# Patient Record
Sex: Female | Born: 1980 | Race: Black or African American | Hispanic: No | Marital: Single | Smoking: Never smoker
Health system: Southern US, Community
[De-identification: ages and names within clinical notes are randomized; demographics above are authoritative.]

## PROBLEM LIST (undated history)

## (undated) DIAGNOSIS — G43909 Migraine, unspecified, not intractable, without status migrainosus: Secondary | ICD-10-CM

## (undated) DIAGNOSIS — O039 Complete or unspecified spontaneous abortion without complication: Secondary | ICD-10-CM

## (undated) DIAGNOSIS — J020 Streptococcal pharyngitis: Secondary | ICD-10-CM

## (undated) HISTORY — PX: HERNIA REPAIR: SHX51

---
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
## 1999-01-24 ENCOUNTER — Encounter: Admission: RE | Admit: 1999-01-24 | Discharge: 1999-01-24 | Payer: Self-pay | Admitting: Family Medicine

## 2003-09-11 ENCOUNTER — Emergency Department (HOSPITAL_COMMUNITY): Admission: EM | Admit: 2003-09-11 | Discharge: 2003-09-12 | Payer: Self-pay | Admitting: Emergency Medicine

## 2004-08-30 ENCOUNTER — Ambulatory Visit: Payer: Self-pay | Admitting: Internal Medicine

## 2006-09-11 ENCOUNTER — Emergency Department (HOSPITAL_COMMUNITY): Admission: EM | Admit: 2006-09-11 | Discharge: 2006-09-11 | Payer: Self-pay | Admitting: Emergency Medicine

## 2008-04-23 ENCOUNTER — Emergency Department (HOSPITAL_COMMUNITY): Admission: EM | Admit: 2008-04-23 | Discharge: 2008-04-24 | Payer: Self-pay | Admitting: Emergency Medicine

## 2009-04-27 ENCOUNTER — Emergency Department (HOSPITAL_COMMUNITY): Admission: EM | Admit: 2009-04-27 | Discharge: 2009-04-27 | Payer: Self-pay | Admitting: Emergency Medicine

## 2012-07-17 DIAGNOSIS — O039 Complete or unspecified spontaneous abortion without complication: Secondary | ICD-10-CM

## 2012-07-17 HISTORY — DX: Complete or unspecified spontaneous abortion without complication: O03.9

## 2013-07-19 ENCOUNTER — Emergency Department (HOSPITAL_COMMUNITY)
Admission: EM | Admit: 2013-07-19 | Discharge: 2013-07-19 | Disposition: A | Discharge status: Home / Self Care | Payer: Self-pay | Attending: Emergency Medicine | Admitting: Emergency Medicine

## 2013-07-19 ENCOUNTER — Encounter (HOSPITAL_COMMUNITY): Payer: Self-pay | Admitting: Emergency Medicine

## 2013-07-19 DIAGNOSIS — J020 Streptococcal pharyngitis: Secondary | ICD-10-CM | POA: Insufficient documentation

## 2013-07-19 LAB — RAPID STREP SCREEN (MED CTR MEBANE ONLY): Streptococcus, Group A Screen (Direct): POSITIVE — AB

## 2013-07-19 MED ORDER — AMOXICILLIN-POT CLAVULANATE 875-125 MG PO TABS: 1.0000 | ORAL_TABLET | Freq: Two times a day (BID) | ORAL | Status: DC

## 2013-07-19 NOTE — ED Provider Notes (Signed)
CSN: [id number]     Arrival date & time 07/19/13  2001 History  This chart was scribed for non-physician practitioner, Paulita Cradle, PA-C,working with Layla Maw Ward, DO, by Karle Plumber, ED Scribe.  This patient was seen in room WTR6/WTR6 and the patient's care was started at 9:48 PM.  Chief Complaint  Patient presents with  . Sore Throat   The history is provided by the patient. No language interpreter was used.   HPI Comments:  Joanne Shelton is a 33 y.o. female who presents to the Emergency Department complaining of a worsening sore throat. The pain is aching and severe and equal on both sides. Pt states she was treated for strep and finished the 10-day amoxicillin course seven days ago. Pt states her symptoms improved, but returned yesterday as itchy, scratchy pain and white patches on her throat. She states she experienced emesis yesterday that has now resolved. Pt denies fever.   History reviewed. No pertinent past medical history. History reviewed. No pertinent past surgical history. History reviewed. No pertinent family history. History  Substance Use Topics  . Smoking status: Never Smoker   . Smokeless tobacco: Not on file  . Alcohol Use: Yes     Comment: occ   OB History   Grav Para Term Preterm Abortions TAB SAB Ect Mult Living                 Review of Systems  Constitutional: Negative for fever.  HENT: Positive for sore throat.   All other systems reviewed and are negative.    Allergies  Codeine  Home Medications   Current Outpatient Rx  Name  Route  Sig  Dispense  Refill  . DiphenhydrAMINE HCl (ALLERGY MEDICATION PO)   Oral   Take 1 tablet by mouth daily as needed (allergy symptoms).         . Multiple Vitamin (MULTIVITAMIN WITH MINERALS) TABS tablet   Oral   Take 1 tablet by mouth daily.         . norgestimate-ethinyl estradiol (ORTHO-CYCLEN,SPRINTEC,PREVIFEM) 0.25-35 MG-MCG tablet   Oral   Take 1 tablet by mouth daily.           Triage Vitals: BP 120/71  Pulse 97  Temp(Src) 98.3 F (36.8 C) (Oral)  Resp 18  Ht 5\' 2"  (1.575 m)  Wt 127 lb (57.607 kg)  BMI 23.22 kg/m2  SpO2 100%  LMP 07/13/2013 Physical Exam  Nursing note and vitals reviewed. Constitutional: She is oriented to person, place, and time. She appears well-developed and well-nourished.  HENT:  Head: Normocephalic and atraumatic.  Bilateral tonsillar erythema with white exudate.   Eyes: EOM are normal.  Neck: Normal range of motion.  Cardiovascular: Normal rate.   Pulmonary/Chest: Effort normal.  Musculoskeletal: Normal range of motion.  Lymphadenopathy:    She has cervical adenopathy.  Neurological: She is alert and oriented to person, place, and time.  Skin: Skin is warm and dry.  Psychiatric: She has a normal mood and affect. Her behavior is normal.    ED Course  Procedures (including critical care time) DIAGNOSTIC STUDIES: Oxygen Saturation is 100% on RA, normal by my interpretation.   COORDINATION OF CARE: 9:51 PM- Will treat with Augmentin for strep throat. Pt verbalizes understanding and agrees to plan.  Medications - No data to display  Labs Review Labs Reviewed  RAPID STREP SCREEN - Abnormal; Notable for the following:    Streptococcus, Group A Screen (Direct) POSITIVE (*)    All other components  within normal limits   Imaging Review No results found.  EKG Interpretation   None       MDM   1. Strep throat     10:00 PM Patient recently finished a course of amoxicillin for strep throat. Patient will have Augmentin this time due to suspected failure of treatment. Vitals stable and patient afebrile. Patient will return to the ED with worsening or concerning symptoms.   I personally performed the services described in this documentation, which was scribed in my presence. The recorded information has been reviewed and is accurate.    Emilia BeckKaitlyn Rett Stehlik, PA-C 07/19/13 2201

## 2013-07-19 NOTE — Discharge Instructions (Signed)
Take augmentin as directed until gone. Refer to attached documents for more information. Return to the ED with worsening or concerning symptoms.  °

## 2013-07-19 NOTE — ED Notes (Signed)
Patient reports that she has recently been treated for strep. The patient reports that the "white spotsd are back"

## 2013-07-19 NOTE — ED Provider Notes (Signed)
Medical screening examination/treatment/procedure(s) were performed by non-physician practitioner and as supervising physician I was immediately available for consultation/collaboration.  EKG Interpretation   None         Gatlin Kittell N Netanya Yazdani, DO 07/19/13 2350 

## 2014-06-28 ENCOUNTER — Emergency Department (HOSPITAL_COMMUNITY)
Admission: EM | Admit: 2014-06-28 | Discharge: 2014-06-28 | Disposition: A | Discharge status: Home / Self Care | Payer: Self-pay | Attending: Emergency Medicine | Admitting: Emergency Medicine

## 2014-06-28 ENCOUNTER — Encounter (HOSPITAL_COMMUNITY): Payer: Self-pay

## 2014-06-28 DIAGNOSIS — Z79899 Other long term (current) drug therapy: Secondary | ICD-10-CM | POA: Insufficient documentation

## 2014-06-28 DIAGNOSIS — Z791 Long term (current) use of non-steroidal anti-inflammatories (NSAID): Secondary | ICD-10-CM | POA: Insufficient documentation

## 2014-06-28 DIAGNOSIS — Z792 Long term (current) use of antibiotics: Secondary | ICD-10-CM | POA: Insufficient documentation

## 2014-06-28 DIAGNOSIS — R000 Tachycardia, unspecified: Secondary | ICD-10-CM | POA: Insufficient documentation

## 2014-06-28 DIAGNOSIS — J0100 Acute maxillary sinusitis, unspecified: Secondary | ICD-10-CM | POA: Insufficient documentation

## 2014-06-28 LAB — RAPID STREP SCREEN (MED CTR MEBANE ONLY): Streptococcus, Group A Screen (Direct): NEGATIVE

## 2014-06-28 MED ORDER — DEXAMETHASONE SODIUM PHOSPHATE 10 MG/ML IJ SOLN
10.0000 mg | Freq: Once | INTRAMUSCULAR | Status: AC
  Administered 2014-06-28: 10 mg via INTRAMUSCULAR
  Filled 2014-06-28: qty 1

## 2014-06-28 MED ORDER — NAPROXEN 500 MG PO TABS
500.0000 mg | ORAL_TABLET | Freq: Once | ORAL | Status: AC
  Administered 2014-06-28: 500 mg via ORAL
  Filled 2014-06-28: qty 1

## 2014-06-28 MED ORDER — NAPROXEN 500 MG PO TABS: 500.0000 mg | ORAL_TABLET | Freq: Two times a day (BID) | ORAL | Status: DC

## 2014-06-28 MED ORDER — SALINE SPRAY 0.65 % NA SOLN
1.0000 | Freq: Once | NASAL | Status: AC
  Administered 2014-06-28: 1 via NASAL
  Filled 2014-06-28: qty 44

## 2014-06-28 NOTE — ED Provider Notes (Signed)
CSN: [id number]     Arrival date & time 06/28/14  2053 History   First MD Initiated Contact with Patient 06/28/14 2126     This chart was scribed for non-physician practitioner, Antony MaduraKelly Sacoya Mcgourty, PA-C working with Warnell Foresterrey Wofford, MD by Arlan OrganAshley Leger, ED Scribe. This patient was seen in room WTR7/WTR7 and the patient's care was started at 10:00 PM.   Chief Complaint  Patient presents with  . Sore Throat  . Headache   The history is provided by the patient. No language interpreter was used.    HPI Comments: Joanne Shelton is a 33 y.o. female who presents to the Emergency Department complaining of constant sore throat onset this morning that has progressively worsened. She also reports painful swallowing, rhinorrhea, and nasal congestion. Pt took 10 ml of Amoxicillin at 5:30 this evening without any improvement. She has also tried L-lysine with no relief for symptoms. Pt denies any drooling, inability to swallow, vomiting, cough, SOB, diarrhea. She states her boyfriend was recently sick and she drank from his straw yesterday. Pt recently had strep approximately 1 month ago and was treated with Amoxicillin. Pt with known allergy to Codeine.  History reviewed. No pertinent past medical history. History reviewed. No pertinent past surgical history. History reviewed. No pertinent family history. History  Substance Use Topics  . Smoking status: Never Smoker   . Smokeless tobacco: Not on file  . Alcohol Use: Yes     Comment: occ   OB History    No data available      Review of Systems  Constitutional: Positive for chills. Negative for fever.  HENT: Positive for congestion, rhinorrhea and sore throat. Negative for trouble swallowing.   Neurological: Positive for headaches.  All other systems reviewed and are negative.   Allergies  Codeine  Home Medications   Prior to Admission medications   Medication Sig Start Date End Date Taking? Authorizing Provider  AMOXICILLIN PO Take 10 mLs by  mouth 2 (two) times daily.   Yes Historical Provider, MD  Chlorpheniramine Maleate (ALLERGY PO) Take 1 tablet by mouth daily.   Yes Historical Provider, MD  L-Lysine 500 MG TABS Take 1 tablet by mouth daily.   Yes Historical Provider, MD  medroxyPROGESTERone (DEPO-PROVERA) 150 MG/ML injection Inject 150 mg into the muscle every 3 (three) months.   Yes Historical Provider, MD  amoxicillin-clavulanate (AUGMENTIN) 875-125 MG per tablet Take 1 tablet by mouth every 12 (twelve) hours. 07/19/13   Kaitlyn Szekalski, PA-C  naproxen (NAPROSYN) 500 MG tablet Take 1 tablet (500 mg total) by mouth 2 (two) times daily. 06/28/14   Antony MaduraKelly Lilith Solana, PA-C   Triage Vitals: BP 107/69 mmHg  Temp(Src) 98.5 F (36.9 C) (Oral)  Resp 125  SpO2 100%   Physical Exam  Constitutional: She is oriented to person, place, and time. She appears well-developed and well-nourished. No distress.  Nontoxic/nonseptic appearing  HENT:  Head: Normocephalic and atraumatic.  Right Ear: Tympanic membrane, external ear and ear canal normal.  Left Ear: Tympanic membrane, external ear and ear canal normal.  Nose: Right sinus exhibits maxillary sinus tenderness and frontal sinus tenderness (Mild). Left sinus exhibits no maxillary sinus tenderness and no frontal sinus tenderness.  Mouth/Throat: Uvula is midline and mucous membranes are normal. Posterior oropharyngeal erythema present. No oropharyngeal exudate or posterior oropharyngeal edema.  Mild tonsillar erythema bilaterally without exudates. Uvula midline. Patient tolerating secretions without difficulty. No voice changes or muffling.  Eyes: Conjunctivae and EOM are normal. Pupils are equal, round, and reactive  to light. No scleral icterus.  Neck: Normal range of motion. Neck supple.  No nuchal rigidity or meningismus  Cardiovascular: Regular rhythm, normal heart sounds and intact distal pulses.  Tachycardia present.   Mild tachycardia  Pulmonary/Chest: Effort normal. No respiratory  distress. She has no wheezes. She has no rales.  Lungs clear to auscultation. Respirations even and unlabored.  Abdominal: She exhibits no distension.  Musculoskeletal: Normal range of motion.  Neurological: She is alert and oriented to person, place, and time. She exhibits normal muscle tone. Coordination normal.  Skin: Skin is warm and dry. No rash noted. She is not diaphoretic. No erythema. No pallor.  Psychiatric: She has a normal mood and affect. Her behavior is normal.  Nursing note and vitals reviewed.   ED Course  Procedures (including critical care time)  DIAGNOSTIC STUDIES: Oxygen Saturation is 100% on RA, Normal by my interpretation.    COORDINATION OF CARE: 10:03 PM- Will order rapid strep screen. Discussed treatment plan with pt at bedside and pt agreed to plan.     Labs Review Labs Reviewed  RAPID STREP SCREEN  CULTURE, GROUP A STREP    Imaging Review No results found.   EKG Interpretation None      MDM   Final diagnoses:  Acute maxillary sinusitis, recurrence not specified    Patient complaining of symptoms of sinusitis. Mild to moderate symptoms of clear/yellow nasal discharge/congestion and scratchy throat with cough for less than 10 days. Patient is afebrile. No concern for acute bacterial rhinosinusitis; likely viral in nature. Patient discharged with symptomatic treatment. Patient instructions given for warm saline nasal washes. Recommendations for follow-up with primary care physician. Return precautions provided and patient agreeable to plan with no unaddressed concerns.  I personally performed the services described in this documentation, which was scribed in my presence. The recorded information has been reviewed and is accurate.     Antony MaduraKelly Callyn Severtson, PA-C 06/28/14 2212  Warnell Foresterrey Wofford, MD 07/01/14 [phone]

## 2014-06-28 NOTE — ED Notes (Signed)
Pt states that she woke up this am and felt like she had strep, she states that she has a hx of strep, she became really hot and achy, she also complains of a headache

## 2014-06-28 NOTE — Discharge Instructions (Signed)
Recommend Ocean Nasal spray and Naproxen as well as rest and fluid hydration. You may use over the counter decongestants as needed for sinus congestion. Follow up with a primary care doctor. Return as needed if symptoms worsen.  Sinusitis Sinusitis is redness, soreness, and inflammation of the paranasal sinuses. Paranasal sinuses are air pockets within the bones of your face (beneath the eyes, the middle of the forehead, or above the eyes). In healthy paranasal sinuses, mucus is able to drain out, and air is able to circulate through them by way of your nose. However, when your paranasal sinuses are inflamed, mucus and air can become trapped. This can allow bacteria and other germs to grow and cause infection. Sinusitis can develop quickly and last only a short time (acute) or continue over a long period (chronic). Sinusitis that lasts for more than 12 weeks is considered chronic.  CAUSES  Causes of sinusitis include:  Allergies.  Structural abnormalities, such as displacement of the cartilage that separates your nostrils (deviated septum), which can decrease the air flow through your nose and sinuses and affect sinus drainage.  Functional abnormalities, such as when the small hairs (cilia) that line your sinuses and help remove mucus do not work properly or are not present. SIGNS AND SYMPTOMS  Symptoms of acute and chronic sinusitis are the same. The primary symptoms are pain and pressure around the affected sinuses. Other symptoms include:  Upper toothache.  Earache.  Headache.  Bad breath.  Decreased sense of smell and taste.  A cough, which worsens when you are lying flat.  Fatigue.  Fever.  Thick drainage from your nose, which often is green and may contain pus (purulent).  Swelling and warmth over the affected sinuses. DIAGNOSIS  Your health care provider will perform a physical exam. During the exam, your health care provider may:  Look in your nose for signs of abnormal  growths in your nostrils (nasal polyps).  Tap over the affected sinus to check for signs of infection.  View the inside of your sinuses (endoscopy) using an imaging device that has a light attached (endoscope). If your health care provider suspects that you have chronic sinusitis, one or more of the following tests may be recommended:  Allergy tests.  Nasal culture. A sample of mucus is taken from your nose, sent to a lab, and screened for bacteria.  Nasal cytology. A sample of mucus is taken from your nose and examined by your health care provider to determine if your sinusitis is related to an allergy. TREATMENT  Most cases of acute sinusitis are related to a viral infection and will resolve on their own within 10 days. Sometimes medicines are prescribed to help relieve symptoms (pain medicine, decongestants, nasal steroid sprays, or saline sprays).  However, for sinusitis related to a bacterial infection, your health care provider will prescribe antibiotic medicines. These are medicines that will help kill the bacteria causing the infection.  Rarely, sinusitis is caused by a fungal infection. In theses cases, your health care provider will prescribe antifungal medicine. For some cases of chronic sinusitis, surgery is needed. Generally, these are cases in which sinusitis recurs more than 3 times per year, despite other treatments. HOME CARE INSTRUCTIONS   Drink plenty of water. Water helps thin the mucus so your sinuses can drain more easily.  Use a humidifier.  Inhale steam 3 to 4 times a day (for example, sit in the bathroom with the shower running).  Apply a warm, moist washcloth to your  face 3 to 4 times a day, or as directed by your health care provider.  Use saline nasal sprays to help moisten and clean your sinuses.  Take medicines only as directed by your health care provider.  If you were prescribed either an antibiotic or antifungal medicine, finish it all even if you start  to feel better. SEEK IMMEDIATE MEDICAL CARE IF:  You have increasing pain or severe headaches.  You have nausea, vomiting, or drowsiness.  You have swelling around your face.  You have vision problems.  You have a stiff neck.  You have difficulty breathing. MAKE SURE YOU:   Understand these instructions.  Will watch your condition.  Will get help right away if you are not doing well or get worse. Document Released: 07/03/2005 Document Revised: 11/17/2013 Document Reviewed: 07/18/2011 [facility] Patient Information 2015 [employer] [place]. This information is not intended to replace advice given to you by your health care provider. Make sure you discuss any questions you have with your health care provider.

## 2014-06-28 NOTE — ED Notes (Signed)
Pt took one dose of amoxicillian at 1730 this evening.

## 2014-06-30 LAB — CULTURE, GROUP A STREP

## 2014-11-23 ENCOUNTER — Encounter (HOSPITAL_COMMUNITY): Payer: Self-pay | Admitting: Emergency Medicine

## 2014-11-23 ENCOUNTER — Emergency Department (HOSPITAL_COMMUNITY)
Admission: EM | Admit: 2014-11-23 | Discharge: 2014-11-23 | Disposition: A | Discharge status: Home / Self Care | Payer: Self-pay | Attending: Emergency Medicine | Admitting: Emergency Medicine

## 2014-11-23 DIAGNOSIS — Z792 Long term (current) use of antibiotics: Secondary | ICD-10-CM | POA: Insufficient documentation

## 2014-11-23 DIAGNOSIS — J029 Acute pharyngitis, unspecified: Secondary | ICD-10-CM | POA: Insufficient documentation

## 2014-11-23 DIAGNOSIS — Z791 Long term (current) use of non-steroidal anti-inflammatories (NSAID): Secondary | ICD-10-CM | POA: Insufficient documentation

## 2014-11-23 LAB — RAPID STREP SCREEN (MED CTR MEBANE ONLY): Streptococcus, Group A Screen (Direct): NEGATIVE

## 2014-11-23 NOTE — ED Provider Notes (Signed)
CSN: [id number]     Arrival date & time 11/23/14  1725 History  This chart was scribed for non-physician practitioner, Santiago GladHeather Rogue Pautler, PA-C,working with Raeford RazorStephen Kohut, MD, by Karle PlumberJennifer Tensley, ED Scribe. This patient was seen in room WTR5/WTR5 and the patient's care was started at 6:24 PM.  Chief Complaint  Patient presents with  . Sore Throat   Patient is a 34 y.o. female presenting with pharyngitis. The history is provided by the patient and medical records. No language interpreter was used.  Sore Throat Pertinent negatives include no headaches.    HPI Comments:  Joanne Shelton is a 34 y.o. female who presents to the Emergency Department complaining of worsening sore throat that began two days ago. She reports associated subjective fever and swollen tonsils. She reports coughing. She has not done anything to treat her symptoms. Swallowing makes the pain worse. Denies alleviating factors. Denies nausea, vomiting, chills or HA.  History reviewed. No pertinent past medical history. History reviewed. No pertinent past surgical history. History reviewed. No pertinent family history. History  Substance Use Topics  . Smoking status: Never Smoker   . Smokeless tobacco: Not on file  . Alcohol Use: Yes     Comment: occ   OB History    No data available     Review of Systems  Constitutional: Positive for fever (subjective).  HENT: Positive for sore throat.   Respiratory: Positive for cough.   Gastrointestinal: Negative for nausea and vomiting.  Neurological: Negative for headaches.    Allergies  Codeine  Home Medications   Prior to Admission medications   Medication Sig Start Date End Date Taking? Authorizing Provider  AMOXICILLIN PO Take 10 mLs by mouth 2 (two) times daily.    Historical Provider, MD  amoxicillin-clavulanate (AUGMENTIN) 875-125 MG per tablet Take 1 tablet by mouth every 12 (twelve) hours. 07/19/13   Kaitlyn Szekalski, PA-C  Chlorpheniramine Maleate (ALLERGY PO)  Take 1 tablet by mouth daily.    Historical Provider, MD  L-Lysine 500 MG TABS Take 1 tablet by mouth daily.    Historical Provider, MD  medroxyPROGESTERone (DEPO-PROVERA) 150 MG/ML injection Inject 150 mg into the muscle every 3 (three) months.    Historical Provider, MD  naproxen (NAPROSYN) 500 MG tablet Take 1 tablet (500 mg total) by mouth 2 (two) times daily. 06/28/14   Antony MaduraKelly Humes, PA-C   Triage Vitals: BP 110/70 mmHg  Pulse 108  Temp(Src) 98.1 F (36.7 C) (Oral)  Resp 16  SpO2 100% Physical Exam  Constitutional: She is oriented to person, place, and time. She appears well-developed and well-nourished.  HENT:  Head: Normocephalic and atraumatic.  Mouth/Throat: Uvula is midline, oropharynx is clear and moist and mucous membranes are normal. No trismus in the jaw. No uvula swelling. No oropharyngeal exudate, posterior oropharyngeal edema, posterior oropharyngeal erythema or tonsillar abscesses.  Eyes: EOM are normal.  Neck: Normal range of motion.  Cardiovascular: Normal rate, regular rhythm and normal heart sounds.   Pulmonary/Chest: Effort normal and breath sounds normal.  Musculoskeletal: Normal range of motion.  Lymphadenopathy:    She has no cervical adenopathy.  Neurological: She is alert and oriented to person, place, and time.  Skin: Skin is warm and dry.  Psychiatric: She has a normal mood and affect. Her behavior is normal.  Nursing note and vitals reviewed.   ED Course  Procedures (including critical care time) DIAGNOSTIC STUDIES: Oxygen Saturation is 100% on RA, normal by my interpretation.   COORDINATION OF CARE: 6:28 PM- Advised  pt that her symptoms were likely viral and strep test is negative. Advised pt to follow up with PCP for continued or worsening symptoms. Pt verbalizes understanding and agrees to plan.  Medications - No data to display  Labs Review Labs Reviewed  RAPID STREP SCREEN  CULTURE, GROUP A STREP    Imaging Review No results found.    EKG Interpretation None      MDM   Final diagnoses:  None   Patient presents today with sore throat.  Rapid strep negative.  No signs of Peritonsillar Abscess or Retropharyngeal Abscess.  Patient stable for discharge.  Return precautions given.    I personally performed the services described in this documentation, which was scribed in my presence. The recorded information has been reviewed and is accurate.    Santiago GladHeather Kihanna Kamiya, PA-C 11/25/14 2142  Raeford RazorStephen Kohut, MD 11/26/14 [phone]

## 2014-11-23 NOTE — ED Notes (Signed)
Patient c/o dry throat, enlarged reddened tonsils onset 2 days ago. Pt states she has Hx of frequent strep throat.

## 2014-11-27 LAB — CULTURE, GROUP A STREP

## 2015-09-14 ENCOUNTER — Encounter (HOSPITAL_COMMUNITY): Payer: Self-pay

## 2015-09-14 ENCOUNTER — Emergency Department (HOSPITAL_COMMUNITY)
Admission: EM | Admit: 2015-09-14 | Discharge: 2015-09-14 | Disposition: A | Discharge status: Home / Self Care | Payer: Self-pay | Attending: Emergency Medicine | Admitting: Emergency Medicine

## 2015-09-14 DIAGNOSIS — Z79899 Other long term (current) drug therapy: Secondary | ICD-10-CM | POA: Insufficient documentation

## 2015-09-14 DIAGNOSIS — Z77098 Contact with and (suspected) exposure to other hazardous, chiefly nonmedicinal, chemicals: Secondary | ICD-10-CM | POA: Insufficient documentation

## 2015-09-14 DIAGNOSIS — Z791 Long term (current) use of non-steroidal anti-inflammatories (NSAID): Secondary | ICD-10-CM | POA: Insufficient documentation

## 2015-09-14 DIAGNOSIS — Z792 Long term (current) use of antibiotics: Secondary | ICD-10-CM | POA: Insufficient documentation

## 2015-09-14 MED ORDER — ERYTHROMYCIN 5 MG/GM OP OINT: TOPICAL_OINTMENT | OPHTHALMIC | Status: DC

## 2015-09-14 NOTE — ED Notes (Signed)
Per EMS- Patient was at work cleaning and sat the bleach down which splashed on her face and into her right eye only. Patient rinsed right eye with 25 large cups of water prior to EMS arrival. Patient has slight redness to the right eye and c/o stinging and blurred vision.
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
## 2015-09-14 NOTE — Discharge Instructions (Signed)
Please read attached information. If you experience any new or worsening signs or symptoms please return to the emergency room for evaluation. Please follow-up with your primary care provider or specialist as discussed. Please use medication prescribed only as directed and discontinue taking if you have any concerning signs or symptoms.   °

## 2016-05-24 ENCOUNTER — Encounter (HOSPITAL_COMMUNITY): Payer: Self-pay | Admitting: Emergency Medicine

## 2016-05-24 ENCOUNTER — Emergency Department (HOSPITAL_COMMUNITY)
Admission: EM | Admit: 2016-05-24 | Discharge: 2016-05-24 | Disposition: A | Discharge status: Home / Self Care | Payer: Self-pay | Attending: Emergency Medicine | Admitting: Emergency Medicine

## 2016-05-24 DIAGNOSIS — N938 Other specified abnormal uterine and vaginal bleeding: Secondary | ICD-10-CM | POA: Insufficient documentation

## 2016-05-24 DIAGNOSIS — G43809 Other migraine, not intractable, without status migrainosus: Secondary | ICD-10-CM | POA: Insufficient documentation

## 2016-05-24 LAB — BASIC METABOLIC PANEL
Anion gap: 5 (ref 5–15)
BUN: 10 mg/dL (ref 6–20)
CALCIUM: 8.8 mg/dL — AB (ref 8.9–10.3)
CO2: 26 mmol/L (ref 22–32)
CREATININE: 0.62 mg/dL (ref 0.44–1.00)
Chloride: 108 mmol/L (ref 101–111)
GFR calc Af Amer: >60 mL/min (ref >60)
GLUCOSE: 79 mg/dL (ref 65–99)
Potassium: 3.8 mmol/L (ref 3.5–5.1)
SODIUM: 139 mmol/L (ref 135–145)

## 2016-05-24 LAB — CBC
HCT: 34.8 % — ABNORMAL LOW (ref 36.0–46.0)
Hemoglobin: 11.2 g/dL — ABNORMAL LOW (ref 12.0–15.0)
MCH: 22.3 pg — AB (ref 26.0–34.0)
MCHC: 32.2 g/dL (ref 30.0–36.0)
MCV: 69.3 fL — AB (ref 78.0–100.0)
PLATELETS: 263 10*3/uL (ref 150–400)
RBC: 5.02 MIL/uL (ref 3.87–5.11)
RDW: 15.1 % (ref 11.5–15.5)
WBC: 5.9 10*3/uL (ref 4.0–10.5)

## 2016-05-24 LAB — URINALYSIS, ROUTINE W REFLEX MICROSCOPIC
BILIRUBIN URINE: NEGATIVE
GLUCOSE, UA: NEGATIVE mg/dL
KETONES UR: NEGATIVE mg/dL
Leukocytes, UA: NEGATIVE
Nitrite: NEGATIVE
PH: 6 (ref 5.0–8.0)
Protein, ur: NEGATIVE mg/dL
SPECIFIC GRAVITY, URINE: 1.008 (ref 1.005–1.030)

## 2016-05-24 LAB — URINE MICROSCOPIC-ADD ON
Bacteria, UA: NONE SEEN
RBC / HPF: NONE SEEN RBC/hpf (ref 0–5)
WBC UA: NONE SEEN WBC/hpf (ref 0–5)

## 2016-05-24 LAB — I-STAT BETA HCG BLOOD, ED (MC, WL, AP ONLY)

## 2016-05-24 MED ORDER — ONDANSETRON 8 MG PO TBDP: 8.0000 mg | ORAL_TABLET | Freq: Three times a day (TID) | ORAL | 0 refills | Status: DC | PRN

## 2016-05-24 MED ORDER — NAPROXEN 500 MG PO TABS: 500.0000 mg | ORAL_TABLET | Freq: Two times a day (BID) | ORAL | 0 refills | Status: DC

## 2016-05-24 MED ORDER — KETOROLAC TROMETHAMINE 30 MG/ML IJ SOLN
30.0000 mg | Freq: Once | INTRAMUSCULAR | Status: AC
  Administered 2016-05-24: 30 mg via INTRAVENOUS
  Filled 2016-05-24: qty 1

## 2016-05-24 MED ORDER — ONDANSETRON HCL 4 MG/2ML IJ SOLN
4.0000 mg | Freq: Once | INTRAMUSCULAR | Status: AC
  Administered 2016-05-24: 4 mg via INTRAVENOUS
  Filled 2016-05-24: qty 2

## 2016-05-24 MED ORDER — SODIUM CHLORIDE 0.9 % IV BOLUS (SEPSIS)
1000.0000 mL | Freq: Once | INTRAVENOUS | Status: AC
  Administered 2016-05-24: 1000 mL via INTRAVENOUS

## 2016-05-24 MED ORDER — PROCHLORPERAZINE EDISYLATE 5 MG/ML IJ SOLN: 10.0000 mg | Freq: Four times a day (QID) | INTRAMUSCULAR | Status: DC | PRN

## 2016-05-24 NOTE — ED Triage Notes (Signed)
Patient states she has a history of migraines.This migraine started last night.  Upon waking, she has a headache, light sensivity, seeing white spots, pain that radiates to her right neck and shoulder.  Patient has taken 5 ibouhpren since 9pm last night. Denies LOC.

## 2016-05-24 NOTE — ED Provider Notes (Signed)
[facility]-EMERGENCY DEPT Provider Note   CSN: [id number] Arrival date & time: 05/24/16  1123     History   Chief Complaint Chief Complaint  Patient presents with  . Migraine    HPI Joanne Shelton is a 35 y.o. female.  HPI Patient ports long-standing history of migraine reports worsening right-sided headache with some visual spots noted out of her right eye.  She is sensitive to light.  She reports the pain radiates from her right lateral face and her right neck and shoulder.  Should ibuprofen last night without any improvement in her symptoms.  She denies weakness of her arms or legs.  No recent injury or trauma.  Her pain is severe in severity.  No other complaints at this time.  No fevers or chills or neck pain.   History reviewed. No pertinent past medical history.  There are no active problems to display for this patient.   Past Surgical History:  Procedure Laterality Date  . HERNIA REPAIR      OB History    No data available       Home Medications    Prior to Admission medications   Medication Sig Start Date End Date Taking? Authorizing Provider  cetirizine (ZYRTEC) 10 MG tablet Take 10 mg by mouth daily.   Yes Historical Provider, MD  ibuprofen (ADVIL,MOTRIN) 200 MG tablet Take 1,000 mg by mouth every 6 (six) hours as needed for mild pain (pain).   Yes Historical Provider, MD  norgestimate-ethinyl estradiol (ORTHO-CYCLEN,SPRINTEC,PREVIFEM) 0.25-35 MG-MCG tablet Take 1 tablet by mouth daily. Pt states she was only put on med for migraines, stopped taking it regularly due to ineffectiveness   Yes Historical Provider, MD  naproxen (NAPROSYN) 500 MG tablet Take 1 tablet (500 mg total) by mouth 2 (two) times daily. 05/24/16   Azalia BilisKevin Lyrique Hakim, MD  ondansetron (ZOFRAN ODT) 8 MG disintegrating tablet Take 1 tablet (8 mg total) by mouth every 8 (eight) hours as needed for nausea or vomiting. 05/24/16   Azalia BilisKevin Etna Forquer, MD    Family History No family history on file.  Social  History Social History  Substance Use Topics  . Smoking status: Never Smoker  . Smokeless tobacco: Never Used  . Alcohol use Yes     Comment: occ     Allergies   Codeine   Review of Systems Review of Systems  All other systems reviewed and are negative.    Physical Exam Updated Vital Signs BP 119/91 (BP Location: Left Arm)   Pulse 77   Temp 98.3 F (36.8 C) (Oral)   Resp 16   Ht 5\' 1"  (1.549 m)   Wt 125 lb (56.7 kg)   LMP 05/21/2016   SpO2 100%   BMI 23.62 kg/m   Physical Exam  Constitutional: She is oriented to person, place, and time. She appears well-developed and well-nourished. No distress.  HENT:  Head: Normocephalic and atraumatic.  Eyes: EOM are normal. Pupils are equal, round, and reactive to light.  Neck: Normal range of motion.  Cardiovascular: Normal rate, regular rhythm and normal heart sounds.   Pulmonary/Chest: Effort normal and breath sounds normal.  Abdominal: Soft. She exhibits no distension. There is no tenderness.  Musculoskeletal: Normal range of motion.  Neurological: She is alert and oriented to person, place, and time.  5/5 strength in major muscle groups of  bilateral upper and lower extremities. Speech normal. No facial asymetry.   Skin: Skin is warm and dry.  Psychiatric: She has a normal mood and  affect. Judgment normal.  Nursing note and vitals reviewed.    ED Treatments / Results  Labs (all labs ordered are listed, but only abnormal results are displayed) Labs Reviewed  CBC - Abnormal; Notable for the following:       Result Value   Hemoglobin 11.2 (*)    HCT 34.8 (*)    MCV 69.3 (*)    MCH 22.3 (*)    All other components within normal limits  BASIC METABOLIC PANEL - Abnormal; Notable for the following:    Calcium 8.8 (*)    All other components within normal limits  URINALYSIS, ROUTINE W REFLEX MICROSCOPIC (NOT AT [facility]) - Abnormal; Notable for the following:    Hgb urine dipstick TRACE (*)    All other components  within normal limits  URINE MICROSCOPIC-ADD ON - Abnormal; Notable for the following:    Squamous Epithelial / LPF 0-5 (*)    All other components within normal limits  I-STAT BETA HCG BLOOD, ED ([facility], [facility], [facility] ONLY)    EKG  EKG Interpretation None       Radiology No results found.  Procedures Procedures (including critical care time)  Medications Ordered in ED Medications  sodium chloride 0.9 % bolus 1,000 mL (0 mLs Intravenous Stopped 05/24/16 1323)  ondansetron (ZOFRAN) injection 4 mg (4 mg Intravenous Given 05/24/16 1247)  ketorolac (TORADOL) 30 MG/ML injection 30 mg (30 mg Intravenous Given 05/24/16 1247)     Initial Impression / Assessment and Plan / ED Course  I have reviewed the triage vital signs and the nursing notes.  Pertinent labs & imaging results that were available during my care of the patient were reviewed by me and considered in my medical decision making (see chart for details).  Clinical Course     Feels much better after treatment in the emergency department.  Pregnancy test is negative.  She does report that she has had some abnormal vaginal bleeding over the past several months.  This is likely dysfunctional uterine bleeding and she will need to be referred to gynecology.  She's been [facility] for ongoing follow-up of her migraine headaches.  Normal neurologic exam at this time.  No indication for CT imaging of the head   Final Clinical Impressions(s) / ED Diagnoses   Final diagnoses:  Other migraine without status migrainosus, not intractable  DUB (dysfunctional uterine bleeding)    New Prescriptions Discharge Medication List as of 05/24/2016  1:29 PM    START taking these medications   Details  ondansetron (ZOFRAN ODT) 8 MG disintegrating tablet Take 1 tablet (8 mg total) by mouth every 8 (eight) hours as needed for nausea or vomiting., Starting Wed 05/24/2016, Print         Azalia BilisKevin Michaeleen Down, MD 05/24/16  1814

## 2016-05-24 NOTE — ED Notes (Signed)
ED Provider at bedside. 

## 2016-06-13 ENCOUNTER — Telehealth: Payer: Self-pay | Admitting: *Deleted

## 2016-06-13 ENCOUNTER — Ambulatory Visit: Payer: Self-pay | Admitting: Neurology

## 2016-06-13 NOTE — Telephone Encounter (Signed)
No showed new patient appointment. 

## 2016-09-19 ENCOUNTER — Encounter (HOSPITAL_COMMUNITY): Payer: Self-pay | Admitting: Emergency Medicine

## 2016-09-19 ENCOUNTER — Emergency Department (HOSPITAL_COMMUNITY)
Admission: EM | Admit: 2016-09-19 | Discharge: 2016-09-20 | Disposition: A | Discharge status: Home / Self Care | Payer: Self-pay | Attending: Emergency Medicine | Admitting: Emergency Medicine

## 2016-09-19 DIAGNOSIS — N939 Abnormal uterine and vaginal bleeding, unspecified: Secondary | ICD-10-CM | POA: Insufficient documentation

## 2016-09-19 DIAGNOSIS — Z3202 Encounter for pregnancy test, result negative: Secondary | ICD-10-CM | POA: Insufficient documentation

## 2016-09-19 HISTORY — DX: Complete or unspecified spontaneous abortion without complication: O03.9

## 2016-09-19 LAB — POC URINE PREG, ED: Preg Test, Ur: NEGATIVE

## 2016-09-19 NOTE — Discharge Instructions (Signed)
Read the information below.  You may return to the Emergency Department at any time for worsening condition or any new symptoms that concern you. If you develop high fevers, worsening abdominal pain, uncontrolled vomiting, uncontrolled bleeding, or are unable to tolerate fluids by mouth, return to the ER for a recheck.

## 2016-09-19 NOTE — ED Notes (Signed)
Pt denies pain at this time. Pt states that she has had vaginal bleeding that began at 3 00pm, and noticed 2 Small clots, and states that she had taken a pregnancy test that was positive.

## 2016-09-19 NOTE — ED Provider Notes (Signed)
[facility]-EMERGENCY DEPT Provider Note   CSN: [id number] Arrival date & time: 09/19/16  1423     History   Chief Complaint Chief Complaint  Patient presents with  . Vaginal Bleeding    5 weeks preg    HPI Joanne Shelton is a 36 y.o. female.  HPI   G1P0, single miscarriage at 6 weeks in 2014 p/w vaginal bleeding that began today.  LMP was Feb 2, had an abnormally short period, took two home pregnancy tests (Feb 5) that seemed faintly positive.  She has not taken any since.  Denies fevers, N/V, abdominal pain, back pain, urinary symptoms, other abnormal vaginal discharge, bowel changes.  She does not have an OBGYN.  [facility].     Past Medical History:  Diagnosis Date  . Miscarriage 2014    There are no active problems to display for this patient.   Past Surgical History:  Procedure Laterality Date  . HERNIA REPAIR      OB History    No data available       Home Medications    Prior to Admission medications   Medication Sig Start Date End Date Taking? Authorizing Provider  cetirizine (ZYRTEC) 10 MG tablet Take 10 mg by mouth daily.   Yes Historical Provider, MD  naproxen (NAPROSYN) 500 MG tablet Take 1 tablet (500 mg total) by mouth 2 (two) times daily. 05/24/16  Yes Azalia BilisKevin Campos, MD  norgestimate-ethinyl estradiol (ORTHO-CYCLEN,SPRINTEC,PREVIFEM) 0.25-35 MG-MCG tablet Take 1 tablet by mouth daily.    Yes Historical Provider, MD  ondansetron (ZOFRAN ODT) 8 MG disintegrating tablet Take 1 tablet (8 mg total) by mouth every 8 (eight) hours as needed for nausea or vomiting. Patient not taking: Reported on 09/19/2016 05/24/16   Azalia BilisKevin Campos, MD    Family History No family history on file.  Social History Social History  Substance Use Topics  . Smoking status: Never Smoker  . Smokeless tobacco: Never Used  . Alcohol use Yes     Comment: occ     Allergies   Codeine   Review of Systems Review of Systems  All other  systems reviewed and are negative.    Physical Exam Updated Vital Signs BP 129/88 (BP Location: Left Arm)   Pulse 102   Temp 98.4 F (36.9 C) (Oral)   Resp 16   LMP 08/18/2016   SpO2 100%   Physical Exam  Constitutional: She appears well-developed and well-nourished. No distress.  HENT:  Head: Normocephalic and atraumatic.  Neck: Neck supple.  Cardiovascular: Normal rate and regular rhythm.   Pulmonary/Chest: Effort normal and breath sounds normal. No respiratory distress. She has no wheezes. She has no rales.  Abdominal: Soft. She exhibits no distension. There is tenderness (suprapubic). There is no rebound and no guarding.  Neurological: She is alert.  Skin: She is not diaphoretic.  Nursing note and vitals reviewed.    ED Treatments / Results  Labs (all labs ordered are listed, but only abnormal results are displayed) Labs Reviewed  POC URINE PREG, ED    EKG  EKG Interpretation None       Radiology No results found.  Procedures Procedures (including critical care time)  Medications Ordered in ED Medications - No data to display   Initial Impression / Assessment and Plan / ED Course  I have reviewed the triage vital signs and the nursing notes.  Pertinent labs & imaging results that were available during my care of the  patient were reviewed by me and considered in my medical decision making (see chart for details).     Pt presents with vaginal bleeding.  LMP approximately 1 month ago was shorter than usual.  Pt had faintly positive pregnancy test at home on Feb 5.  Pregnancy test is negative here. Likely regular menstrual period.  Pt advised to retest in a few days if this period is also abnormal and to return or go directly to [facility] hospital for abnormal bleeding, pain.  Pt denies any other abnormal vaginal discharge or other symptoms so she and I decided jointly that no additional testing was needed.  Discussed result, findings, treatment, and follow up   with patient.  Pt given return precautions.  Pt verbalizes understanding and agrees with plan.      Final Clinical Impressions(s) / ED Diagnoses   Final diagnoses:  Vaginal bleeding  Negative pregnancy test    New Prescriptions New Prescriptions   No medications on file     Trixie Dredge, Cordelia Poche 09/19/16 2133    Maia Plan, MD 09/20/16 [phone]

## 2016-09-19 NOTE — ED Triage Notes (Addendum)
Pt reports vaginal bleeding , passing small clots, sts [redacted] weeks pregnant , no prenatal care initiated. denies abd pain. Hx miscarriage x 1.

## 2016-12-20 ENCOUNTER — Encounter (HOSPITAL_COMMUNITY): Payer: Self-pay | Admitting: Emergency Medicine

## 2016-12-20 ENCOUNTER — Emergency Department (HOSPITAL_COMMUNITY)
Admission: EM | Admit: 2016-12-20 | Discharge: 2016-12-20 | Disposition: A | Discharge status: Home / Self Care | Payer: Self-pay | Attending: Emergency Medicine | Admitting: Emergency Medicine

## 2016-12-20 ENCOUNTER — Emergency Department (HOSPITAL_COMMUNITY): Payer: Self-pay

## 2016-12-20 DIAGNOSIS — Y999 Unspecified external cause status: Secondary | ICD-10-CM | POA: Insufficient documentation

## 2016-12-20 DIAGNOSIS — Y939 Activity, unspecified: Secondary | ICD-10-CM | POA: Insufficient documentation

## 2016-12-20 DIAGNOSIS — Z79899 Other long term (current) drug therapy: Secondary | ICD-10-CM | POA: Insufficient documentation

## 2016-12-20 DIAGNOSIS — Y929 Unspecified place or not applicable: Secondary | ICD-10-CM | POA: Insufficient documentation

## 2016-12-20 DIAGNOSIS — W2111XA Struck by baseball bat, initial encounter: Secondary | ICD-10-CM | POA: Insufficient documentation

## 2016-12-20 DIAGNOSIS — S20212A Contusion of left front wall of thorax, initial encounter: Secondary | ICD-10-CM

## 2016-12-20 DIAGNOSIS — S20222A Contusion of left back wall of thorax, initial encounter: Secondary | ICD-10-CM | POA: Insufficient documentation

## 2016-12-20 HISTORY — DX: Migraine, unspecified, not intractable, without status migrainosus: G43.909

## 2016-12-20 MED ORDER — NAPROXEN 500 MG PO TABS
500.0000 mg | ORAL_TABLET | Freq: Once | ORAL | Status: AC
Start: 2016-12-20 — End: 2016-12-20
  Administered 2016-12-20: 500 mg via ORAL
  Filled 2016-12-20: qty 1

## 2016-12-20 MED ORDER — NAPROXEN 500 MG PO TABS: 500.0000 mg | ORAL_TABLET | Freq: Two times a day (BID) | ORAL | 0 refills | Status: DC
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
## 2017-01-13 ENCOUNTER — Emergency Department (HOSPITAL_COMMUNITY): Payer: Self-pay

## 2017-01-13 ENCOUNTER — Encounter (HOSPITAL_COMMUNITY): Payer: Self-pay

## 2017-01-13 DIAGNOSIS — N3001 Acute cystitis with hematuria: Secondary | ICD-10-CM | POA: Insufficient documentation

## 2017-01-13 DIAGNOSIS — J029 Acute pharyngitis, unspecified: Secondary | ICD-10-CM | POA: Insufficient documentation

## 2017-01-13 DIAGNOSIS — Z793 Long term (current) use of hormonal contraceptives: Secondary | ICD-10-CM | POA: Insufficient documentation

## 2017-01-13 DIAGNOSIS — K529 Noninfective gastroenteritis and colitis, unspecified: Secondary | ICD-10-CM | POA: Insufficient documentation

## 2017-01-13 LAB — COMPREHENSIVE METABOLIC PANEL
ALBUMIN: 4 g/dL (ref 3.5–5.0)
ALT: 17 U/L (ref 14–54)
ANION GAP: 10 (ref 5–15)
AST: 22 U/L (ref 15–41)
Alkaline Phosphatase: 57 U/L (ref 38–126)
BUN: 12 mg/dL (ref 6–20)
CHLORIDE: 103 mmol/L (ref 101–111)
CO2: 24 mmol/L (ref 22–32)
Calcium: 8.7 mg/dL — ABNORMAL LOW (ref 8.9–10.3)
Creatinine, Ser: 0.71 mg/dL (ref 0.44–1.00)
GFR calc Af Amer: >60 mL/min (ref >60)
GFR calc non Af Amer: >60 mL/min (ref >60)
GLUCOSE: 130 mg/dL — AB (ref 65–99)
POTASSIUM: 3.6 mmol/L (ref 3.5–5.1)
Sodium: 137 mmol/L (ref 135–145)
TOTAL PROTEIN: 7.5 g/dL (ref 6.5–8.1)
Total Bilirubin: 0.6 mg/dL (ref 0.3–1.2)

## 2017-01-13 LAB — CBC WITH DIFFERENTIAL/PLATELET
BASOS ABS: 0 10*3/uL (ref 0.0–0.1)
Basophils Relative: 0 %
EOS PCT: 0 %
Eosinophils Absolute: 0 10*3/uL (ref 0.0–0.7)
HEMATOCRIT: 31.9 % — AB (ref 36.0–46.0)
HEMOGLOBIN: 10.8 g/dL — AB (ref 12.0–15.0)
LYMPHS ABS: 1.2 10*3/uL (ref 0.7–4.0)
LYMPHS PCT: 8 %
MCH: 23.1 pg — ABNORMAL LOW (ref 26.0–34.0)
MCHC: 33.9 g/dL (ref 30.0–36.0)
MCV: 68.2 fL — AB (ref 78.0–100.0)
MONOS PCT: 9 %
Monocytes Absolute: 1.4 10*3/uL — ABNORMAL HIGH (ref 0.1–1.0)
Neutro Abs: 13 10*3/uL — ABNORMAL HIGH (ref 1.7–7.7)
Neutrophils Relative %: 83 %
Platelets: 222 10*3/uL (ref 150–400)
RBC: 4.68 MIL/uL (ref 3.87–5.11)
RDW: 15.1 % (ref 11.5–15.5)
WBC: 15.6 10*3/uL — ABNORMAL HIGH (ref 4.0–10.5)

## 2017-01-13 LAB — CG4 I-STAT (LACTIC ACID): Lactic Acid, Venous: 0.79 mmol/L (ref 0.5–1.9)

## 2017-01-13 NOTE — ED Triage Notes (Signed)
Fever of 103.0 x 2 days states she is not taking anything for the fever because she hates to take medication states hurts all over.

## 2017-01-14 ENCOUNTER — Emergency Department (HOSPITAL_COMMUNITY)
Admission: EM | Admit: 2017-01-14 | Discharge: 2017-01-14 | Disposition: A | Discharge status: Home / Self Care | Payer: Self-pay | Attending: Emergency Medicine | Admitting: Emergency Medicine

## 2017-01-14 DIAGNOSIS — N3001 Acute cystitis with hematuria: Secondary | ICD-10-CM

## 2017-01-14 DIAGNOSIS — K529 Noninfective gastroenteritis and colitis, unspecified: Secondary | ICD-10-CM

## 2017-01-14 LAB — URINALYSIS, ROUTINE W REFLEX MICROSCOPIC
Bilirubin Urine: NEGATIVE
GLUCOSE, UA: NEGATIVE mg/dL
Ketones, ur: 20 mg/dL — AB
NITRITE: POSITIVE — AB
PH: 5 (ref 5.0–8.0)
Protein, ur: 100 mg/dL — AB
SPECIFIC GRAVITY, URINE: 1.019 (ref 1.005–1.030)

## 2017-01-14 LAB — PREGNANCY, URINE: Preg Test, Ur: NEGATIVE

## 2017-01-14 MED ORDER — DEXTROSE 5 % IV SOLN
1.0000 g | Freq: Once | INTRAVENOUS | Status: AC
  Administered 2017-01-14: 1 g via INTRAVENOUS
  Filled 2017-01-14: qty 10

## 2017-01-14 MED ORDER — SODIUM CHLORIDE 0.9 % IV BOLUS (SEPSIS)
2000.0000 mL | Freq: Once | INTRAVENOUS | Status: AC
  Administered 2017-01-14: 2000 mL via INTRAVENOUS

## 2017-01-14 MED ORDER — ONDANSETRON 4 MG PO TBDP: 4.0000 mg | ORAL_TABLET | Freq: Three times a day (TID) | ORAL | 0 refills | Status: DC | PRN

## 2017-01-14 MED ORDER — KETOROLAC TROMETHAMINE 15 MG/ML IJ SOLN
15.0000 mg | Freq: Once | INTRAMUSCULAR | Status: AC
  Administered 2017-01-14: 15 mg via INTRAVENOUS
  Filled 2017-01-14: qty 1

## 2017-01-14 MED ORDER — ONDANSETRON HCL 4 MG/2ML IJ SOLN
4.0000 mg | Freq: Once | INTRAMUSCULAR | Status: AC
  Administered 2017-01-14: 4 mg via INTRAVENOUS
  Filled 2017-01-14: qty 2

## 2017-01-14 MED ORDER — CEPHALEXIN 500 MG PO CAPS: 500.0000 mg | ORAL_CAPSULE | Freq: Three times a day (TID) | ORAL | 0 refills | Status: DC

## 2017-01-14 NOTE — ED Notes (Signed)
Pt was given ginger ale and ice water for po challenge----- taking sips.  Pt was also reminded of the need for urine specimen; stated "will call " when she's able.

## 2017-01-14 NOTE — ED Provider Notes (Signed)
[facility]-EMERGENCY DEPT Provider Note   CSN: [id number] Arrival date & time: 01/13/17  2211     History   Chief Complaint Chief Complaint  Patient presents with  . Fever    HPI Klaira Pesci is a 36 y.o. female.  The patient presents with symptoms of fever, body aches, vomiting and diarrhea for the past 4 days. She reports 2-3 episodes of emesis and stools, both non-bloody, per day since illness began. No sick contacts. She complains of frontal headache without congestion, sore throat sinus drainage. No rash, neck stiffness, or known recent tick bite. She reports her urine has an odor and appears dark. No pain with urination.    The history is provided by the patient. No language interpreter was used.    Past Medical History:  Diagnosis Date  . Migraine   . Miscarriage 2014    There are no active problems to display for this patient.   Past Surgical History:  Procedure Laterality Date  . HERNIA REPAIR      OB History    No data available       Home Medications    Prior to Admission medications   Medication Sig Start Date End Date Taking? Authorizing Provider  acetaminophen (TYLENOL) 500 MG tablet Take 500 mg by mouth every 6 (six) hours as needed for mild pain.   Yes [provider]  ibuprofen (ADVIL,MOTRIN) 200 MG tablet Take 400 mg by mouth every 6 (six) hours as needed for headache, mild pain or moderate pain.   Yes [provider]  norgestimate-ethinyl estradiol (ORTHO-CYCLEN,SPRINTEC,PREVIFEM) 0.25-35 MG-MCG tablet Take 1 tablet by mouth daily.    Yes [provider]  naproxen (NAPROSYN) 500 MG tablet Take 1 tablet (500 mg total) by mouth 2 (two) times daily. Patient not taking: Reported on 01/14/2017 12/20/16   Antony Madura, PA-C    Family History Family History  Problem Relation Age of Onset  . Diabetes Mother   . Diabetes Other     Social History Social History  Substance Use Topics  . Smoking status: Never Smoker  .  Smokeless tobacco: Never Used  . Alcohol use Yes     Comment: occ     Allergies   Codeine   Review of Systems Review of Systems  Constitutional: Positive for appetite change, chills and fever.  HENT: Negative.  Negative for congestion, rhinorrhea and sore throat.   Respiratory: Negative.  Negative for cough and shortness of breath.   Cardiovascular: Negative.  Negative for chest pain.  Gastrointestinal: Positive for diarrhea, nausea and vomiting. Negative for abdominal pain and blood in stool.  Genitourinary: Negative for dysuria.       See HPI.  Musculoskeletal: Positive for myalgias.  Skin: Negative.  Negative for rash.  Neurological: Positive for headaches. Negative for syncope and weakness.     Physical Exam Updated Vital Signs BP (S) 105/72   Pulse (S) 82   Temp (S) 98.1 F (36.7 C) (Oral)   Resp 18   Ht 5\' 2"  (1.575 m)   Wt 56.7 kg (125 lb)   LMP 01/06/2017   SpO2 (S) 99%   BMI 22.86 kg/m   Physical Exam  Constitutional: She is oriented to person, place, and time. She appears well-developed and well-nourished.  HENT:  Head: Normocephalic.  Nose: Right sinus exhibits frontal sinus tenderness. Left sinus exhibits frontal sinus tenderness.  Mouth/Throat: Uvula is midline. Mucous membranes are dry. No oropharyngeal exudate or posterior oropharyngeal erythema.  Neck: Normal range of  motion. Neck supple.  Cardiovascular: Normal rate and regular rhythm.   No murmur heard. Pulmonary/Chest: Effort normal and breath sounds normal. She has no wheezes. She has no rales.  Abdominal: Soft. Bowel sounds are normal. There is no tenderness. There is no rebound and no guarding.  Musculoskeletal: Normal range of motion.  Neurological: She is alert and oriented to person, place, and time.  Skin: Skin is warm and dry. No rash noted.  Psychiatric: She has a normal mood and affect.     ED Treatments / Results  Labs (all labs ordered are listed, but only abnormal results are  displayed) Labs Reviewed  COMPREHENSIVE METABOLIC PANEL - Abnormal; Notable for the following:       Result Value   Glucose, Bld 130 (*)    Calcium 8.7 (*)    All other components within normal limits  CBC WITH DIFFERENTIAL/PLATELET - Abnormal; Notable for the following:    WBC 15.6 (*)    Hemoglobin 10.8 (*)    HCT 31.9 (*)    MCV 68.2 (*)    MCH 23.1 (*)    Neutro Abs 13.0 (*)    Monocytes Absolute 1.4 (*)    All other components within normal limits  URINALYSIS, ROUTINE W REFLEX MICROSCOPIC  I-STAT CG4 LACTIC ACID, ED  CG4 I-STAT (LACTIC ACID)    EKG  EKG Interpretation None       Radiology Dg Chest 2 View  Result Date: 01/13/2017 CLINICAL DATA:  Fever EXAM: CHEST  2 VIEW COMPARISON:  12/20/2016 FINDINGS: Heart and mediastinal contours are within normal limits. No focal opacities or effusions. No acute bony abnormality. IMPRESSION: No active cardiopulmonary disease. Electronically Signed   By: Charlett NoseKevin  Dover M.D.   On: 01/13/2017 22:34    Procedures Procedures (including critical care time)  Medications Ordered in ED Medications  sodium chloride 0.9 % bolus 2,000 mL (2,000 mLs Intravenous New Bag/Given 01/14/17 0542)  ondansetron (ZOFRAN) injection 4 mg (4 mg Intravenous Given 01/14/17 0542)  ketorolac (TORADOL) 15 MG/ML injection 15 mg (15 mg Intravenous Given 01/14/17 0542)     Initial Impression / Assessment and Plan / ED Course  I have reviewed the triage vital signs and the nursing notes.  Pertinent labs & imaging results that were available during my care of the patient were reviewed by me and considered in my medical decision making (see chart for details).     Patient presents for evaluation of febrile illness. She is overall nontoxic in appearance, VSS. IV fluids provided. Labs are reassuring. Lactic acid negative.   Urine collection delayed. Collected at 7:00 am (ordered at 10:20 pm). Patient care signed out to [facility] Kirichenko, PA-C, pending urine. Feel  symptoms are secondary to viral GI process requiring supportive care only  Final Clinical Impressions(s) / ED Diagnoses   Final diagnoses:  None   1. Gastroenteritis  New Prescriptions New Prescriptions   No medications on file     Danne HarborUpstill, Misheel Gowans, PA-C 01/14/17 [id number]    Zadie RhineWickline, Donald, MD 01/14/17 2325

## 2017-01-14 NOTE — ED Notes (Signed)
ED Provider at bedside. 

## 2017-01-14 NOTE — ED Provider Notes (Signed)
Patient signed out to me at shift change. Patient with nausea, vomiting, diarrhea, fever. Her urinalysis is pending at this time. We will follow-up on urinalysis, make sure patient is able to eat and drink, discharged home on Zofran and prescription for UTI if positive.   9:22 AM Patient's urinalysis shows urinary tract infection. Positive nitrites, large leukocytes, too numerous to count white blood cells, many bacteria. I sent cultures. She received 1 g of Rocephin in emergency department. We'll discharge home with Keflex in addition to Zofran. Patient states she's feeling better, she is eating and drinking. Her vital signs are normal. She is stable for discharge home at this time. No evidence of pyelonephritis. Return precautions discussed. She will follow-up with her doctor in 3-5 days.  Vitals:   01/13/17 2218 01/14/17 0200 01/14/17 0532 01/14/17 0739  BP: 126/83 115/77 (S) 105/72 108/78  Pulse: (!) 123 92 (S) 82 79  Resp: 20 18 18 18   Temp: (!) 101 F (38.3 C) 98.4 F (36.9 C) (S) 98.1 F (36.7 C) 98.2 F (36.8 C)  TempSrc: Oral Oral (S) Oral Oral  SpO2: 100% 97% (S) 99% 99%  Weight: 56.7 kg (125 lb)     Height: 5\' 2"  (1.575 m)         Jaynie CrumbleKirichenko, Chandni Gagan, PA-C 01/14/17 [id number]    Benjiman CorePickering, Nathan, MD 01/14/17 1534

## 2017-01-14 NOTE — Discharge Instructions (Addendum)
Take keflex as prescribed until all gone. Take zofran as prescribed as needed for nausea and vomiting. Follow up with family doctor in 3-5 days for recheck. Return if worsening.

## 2017-01-16 LAB — URINE CULTURE

## 2017-01-17 ENCOUNTER — Telehealth: Payer: Self-pay | Admitting: Emergency Medicine

## 2017-01-17 NOTE — Telephone Encounter (Signed)
Post ED Visit - Positive Culture Follow-up  Culture report reviewed by antimicrobial stewardship pharmacist:  []  Enzo BiNathan Batchelder, Pharm.D. []  Celedonio MiyamotoJeremy Frens, Pharm.D., BCPS AQ-ID []  Garvin FilaMike Maccia, Pharm.D., BCPS []  Georgina PillionElizabeth Martin, [address]Ph[address] BCPS []  NaytahwaushMinh Pham, [address]Pharm[address]D., BCPS, AAHIVP [x]  Estella HuskMichelle Turner, Pharm.D., BCPS, AAHIVP []  Lysle Pearlachel Rumbarger, PharmD, BCPS []  Casilda Carlsaylor Stone, PharmD, BCPS []  Pollyann SamplesAndy Johnston, PharmD, BCPS  Positive urineculture Treated with cephalexin, organism sensitive to the same and no further patient follow-up is required at this time.  Berle MullMiller, Abdulhadi Stopa 01/17/2017, 1:37 PM

## 2017-06-08 ENCOUNTER — Encounter (HOSPITAL_COMMUNITY): Payer: Self-pay

## 2017-06-08 ENCOUNTER — Other Ambulatory Visit: Payer: Self-pay

## 2017-06-08 ENCOUNTER — Emergency Department (HOSPITAL_COMMUNITY)
Admission: EM | Admit: 2017-06-08 | Discharge: 2017-06-08 | Disposition: A | Discharge status: Home / Self Care | Payer: Self-pay | Attending: Emergency Medicine | Admitting: Emergency Medicine

## 2017-06-08 DIAGNOSIS — J029 Acute pharyngitis, unspecified: Secondary | ICD-10-CM | POA: Insufficient documentation

## 2017-06-08 DIAGNOSIS — Z79899 Other long term (current) drug therapy: Secondary | ICD-10-CM | POA: Insufficient documentation

## 2017-06-08 DIAGNOSIS — K121 Other forms of stomatitis: Secondary | ICD-10-CM | POA: Insufficient documentation

## 2017-06-08 LAB — CBC WITH DIFFERENTIAL/PLATELET
BASOS ABS: 0 10*3/uL (ref 0.0–0.1)
Basophils Relative: 0 %
Eosinophils Absolute: 0 10*3/uL (ref 0.0–0.7)
Eosinophils Relative: 0 %
HEMATOCRIT: 33.6 % — AB (ref 36.0–46.0)
Hemoglobin: 11.5 g/dL — ABNORMAL LOW (ref 12.0–15.0)
LYMPHS ABS: 2.1 10*3/uL (ref 0.7–4.0)
LYMPHS PCT: 26 %
MCH: 23.4 pg — AB (ref 26.0–34.0)
MCHC: 34.2 g/dL (ref 30.0–36.0)
MCV: 68.3 fL — AB (ref 78.0–100.0)
Monocytes Absolute: 0.5 10*3/uL (ref 0.1–1.0)
Monocytes Relative: 7 %
NEUTROS ABS: 5.3 10*3/uL (ref 1.7–7.7)
Neutrophils Relative %: 67 %
Platelets: 186 10*3/uL (ref 150–400)
RBC: 4.92 MIL/uL (ref 3.87–5.11)
RDW: 15.5 % (ref 11.5–15.5)
WBC: 8 10*3/uL (ref 4.0–10.5)

## 2017-06-08 LAB — RAPID STREP SCREEN (MED CTR MEBANE ONLY): Streptococcus, Group A Screen (Direct): NEGATIVE

## 2017-06-08 LAB — MONONUCLEOSIS SCREEN: MONO SCREEN: NEGATIVE

## 2017-06-08 MED ORDER — LIDOCAINE VISCOUS 2 % MT SOLN: 5.0000 mL | OROMUCOSAL | 0 refills | Status: DC | PRN

## 2017-06-08 MED ORDER — DEXAMETHASONE 4 MG PO TABS
10.0000 mg | ORAL_TABLET | Freq: Once | ORAL | Status: AC
  Administered 2017-06-08: 22:00:00 10 mg via ORAL
  Filled 2017-06-08: qty 2

## 2017-06-08 NOTE — ED Provider Notes (Signed)
[facility]   CSN: [id number] Arrival date & time: 06/08/17  1938     History   Chief Complaint Chief Complaint  Patient presents with  . Sore Throat    HPI Joanne Shelton is a 36 y.o. female who presents to the ED with sore throat that started 3 days ago. Patient c/o swollen tonsils and spots on her tonsil. She reports low grade fever and chills and glad swelling. The symptoms started 3 days ago.   HPI  Past Medical History:  Diagnosis Date  . Migraine   . Miscarriage 2014    There are no active problems to display for this patient.   Past Surgical History:  Procedure Laterality Date  . HERNIA REPAIR      OB History    No data available       Home Medications    Prior to Admission medications   Medication Sig Start Date End Date Taking? Authorizing Provider  acetaminophen (TYLENOL) 500 MG tablet Take 500 mg by mouth every 6 (six) hours as needed for mild pain.    [provider]  cephALEXin (KEFLEX) 500 MG capsule Take 1 capsule (500 mg total) by mouth 3 (three) times daily. 01/14/17   Kirichenko, Tatyana, PA-C  lidocaine (XYLOCAINE) 2 % solution Use as directed 5 mLs in the mouth or throat every 3 (three) hours as needed for mouth pain. 06/08/17   Janne NapoleonNeese, Takiya Belmares M, NP  norgestimate-ethinyl estradiol (ORTHO-CYCLEN,SPRINTEC,PREVIFEM) 0.25-35 MG-MCG tablet Take 1 tablet by mouth daily.     [provider]  ondansetron (ZOFRAN ODT) 4 MG disintegrating tablet Take 1 tablet (4 mg total) by mouth every 8 (eight) hours as needed for nausea or vomiting. 01/14/17   Elpidio AnisUpstill, Shari, PA-C    Family History Family History  Problem Relation Age of Onset  . Diabetes Mother   . Diabetes Other     Social History Social History   Tobacco Use  . Smoking status: Never Smoker  . Smokeless tobacco: Never Used  Substance Use Topics  . Alcohol use: Yes    Comment: occ  . Drug use: No     Allergies     Codeine   Review of Systems Review of Systems  Constitutional: Positive for chills and fever.  HENT: Positive for ear pain and sore throat. Negative for congestion and trouble swallowing.   Eyes: Negative for discharge and redness.  Respiratory: Negative for cough and shortness of breath.   Cardiovascular: Negative for chest pain.  Gastrointestinal: Negative for abdominal pain, nausea and rectal pain.  Genitourinary: Negative for difficulty urinating.  Musculoskeletal: Positive for myalgias. Negative for neck stiffness.  Skin: Negative for rash.  Neurological: Positive for headaches. Negative for dizziness and syncope.  Psychiatric/Behavioral: Negative for confusion.     Physical Exam Updated Vital Signs BP 126/75 (BP Location: Left Arm)   Pulse 89   Temp 98.8 F (37.1 C) (Oral)   Resp 18   Ht 5\' 2"  (1.575 m)   Wt 54.4 kg (120 lb)   LMP 05/18/2017 (Approximate)   SpO2 100%   BMI 21.95 kg/m   Physical Exam  Constitutional: She appears well-developed and well-nourished. She does not appear ill. No distress.  HENT:  Head: Normocephalic and atraumatic.  Right Ear: Tympanic membrane normal.  Left Ear: Tympanic membrane normal.  Nose: Nose normal.  Mouth/Throat: Uvula is midline and mucous membranes are normal. Posterior oropharyngeal erythema present. Tonsils are 2+ on the right. Tonsils are 1+  on the left.  Ulcer areas noted on posterior pharynx.   Eyes: Conjunctivae and EOM are normal.  Neck: Neck supple.  Cardiovascular: Normal rate and regular rhythm.  Pulmonary/Chest: Effort normal and breath sounds normal.  Musculoskeletal: Normal range of motion.  Lymphadenopathy:    She has cervical adenopathy.  Neurological: She is alert.  Skin: Skin is warm and dry.  Psychiatric: She has a normal mood and affect.  Nursing note and vitals reviewed.    ED Treatments / Results  Labs (all labs ordered are listed, but only abnormal results are displayed) Labs Reviewed   CBC WITH DIFFERENTIAL/PLATELET - Abnormal; Notable for the following components:      Result Value   Hemoglobin 11.5 (*)    HCT 33.6 (*)    MCV 68.3 (*)    MCH 23.4 (*)    All other components within normal limits  RAPID STREP SCREEN (NOT AT [facility])  CULTURE, GROUP A STREP ([facility])  MONONUCLEOSIS SCREEN  CBC WITH DIFFERENTIAL/PLATELET   Radiology No results found.  Procedures Procedures (including critical care time)  Medications Ordered in ED Medications  dexamethasone (DECADRON) tablet 10 mg (10 mg Oral Given 06/08/17 2146)     Initial Impression / Assessment and Plan / ED Course  I have reviewed the triage vital signs and the nursing notes. Pt afebrile without tonsillar exudate, negative strep. Presents with oral ulcers, mild cervical lymphadenopathy, & dysphagia; diagnosis of viral pharyngitis. No abx indicated. Discharged with symptomatic tx for pain  Pt does not appear dehydrated, but did discuss importance of water rehydration. Presentation non concerning for PTA or RPA. No trismus or uvula deviation. Specific return precautions discussed. Pt able to drink water in ED without difficulty with intact air way. Recommended PCP follow up.  Final Clinical Impressions(s) / ED Diagnoses   Final diagnoses:  Viral pharyngitis  Mouth ulcers    ED Discharge Orders        Ordered    lidocaine (XYLOCAINE) 2 % solution  Every  3 hours PRN     06/08/17 2305       Kerrie Buffaloeese, Niasha Devins Beaver ValleyM, NP 06/08/17 2320    Alvira MondaySchlossman, Erin, MD 06/09/17 [phone]

## 2017-06-08 NOTE — ED Triage Notes (Signed)
Pt presents with c/o sore throat that started approx 3 days ago. Pt reports that her tonsils are swollen and she has white spots on her tonsils and bleeding on her tonsils.

## 2017-06-08 NOTE — Discharge Instructions (Signed)
Your strep test and your mono test are negative. The ulcers you have on the back of your throat are caused by a virus. Use the medication to gargle to help the pain in your throat. You can also use salt water gargles. Take the liquid motrin for pain and/or fever. Follow up with your doctor. Return here as needed.

## 2017-06-11 LAB — CULTURE, GROUP A STREP (THRC)

## 2017-07-07 ENCOUNTER — Encounter (HOSPITAL_COMMUNITY): Payer: Self-pay | Admitting: Emergency Medicine

## 2017-07-07 ENCOUNTER — Emergency Department (HOSPITAL_COMMUNITY): Payer: No Typology Code available for payment source

## 2017-07-07 ENCOUNTER — Emergency Department (HOSPITAL_COMMUNITY)
Admission: EM | Admit: 2017-07-07 | Discharge: 2017-07-07 | Disposition: A | Discharge status: Home / Self Care | Payer: No Typology Code available for payment source | Attending: Emergency Medicine | Admitting: Emergency Medicine

## 2017-07-07 DIAGNOSIS — S161XXA Strain of muscle, fascia and tendon at neck level, initial encounter: Secondary | ICD-10-CM | POA: Diagnosis not present

## 2017-07-07 DIAGNOSIS — Y929 Unspecified place or not applicable: Secondary | ICD-10-CM | POA: Insufficient documentation

## 2017-07-07 DIAGNOSIS — Y999 Unspecified external cause status: Secondary | ICD-10-CM | POA: Diagnosis not present

## 2017-07-07 DIAGNOSIS — S40011A Contusion of right shoulder, initial encounter: Secondary | ICD-10-CM | POA: Diagnosis not present

## 2017-07-07 DIAGNOSIS — Z79899 Other long term (current) drug therapy: Secondary | ICD-10-CM | POA: Insufficient documentation

## 2017-07-07 DIAGNOSIS — Y9389 Activity, other specified: Secondary | ICD-10-CM | POA: Diagnosis not present

## 2017-07-07 MED ORDER — NAPROXEN 375 MG PO TABS: 375.0000 mg | ORAL_TABLET | Freq: Two times a day (BID) | ORAL | 0 refills | Status: AC

## 2017-07-07 MED ORDER — CYCLOBENZAPRINE HCL 5 MG PO TABS: 5.0000 mg | ORAL_TABLET | Freq: Two times a day (BID) | ORAL | 0 refills | Status: DC | PRN

## 2017-07-07 MED ORDER — IBUPROFEN 200 MG PO TABS
600.0000 mg | ORAL_TABLET | Freq: Once | ORAL | Status: AC
  Administered 2017-07-07: 600 mg via ORAL
  Filled 2017-07-07: qty 3
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
## 2017-07-07 NOTE — Discharge Instructions (Signed)
Do not take the muscle relaxer if driving as it will make you sleepy. Follow up with your doctor. Return here for worsening symptoms.

## 2017-07-08 ENCOUNTER — Other Ambulatory Visit: Payer: Self-pay

## 2017-07-08 ENCOUNTER — Encounter (HOSPITAL_COMMUNITY): Payer: Self-pay | Admitting: Emergency Medicine

## 2017-07-08 ENCOUNTER — Emergency Department (HOSPITAL_COMMUNITY)
Admission: EM | Admit: 2017-07-08 | Discharge: 2017-07-08 | Disposition: A | Discharge status: Home / Self Care | Payer: No Typology Code available for payment source | Attending: Emergency Medicine | Admitting: Emergency Medicine

## 2017-07-08 DIAGNOSIS — W2210XS Striking against or struck by unspecified automobile airbag, sequela: Secondary | ICD-10-CM | POA: Diagnosis not present

## 2017-07-08 DIAGNOSIS — L245 Irritant contact dermatitis due to other chemical products: Secondary | ICD-10-CM | POA: Insufficient documentation

## 2017-07-08 DIAGNOSIS — R21 Rash and other nonspecific skin eruption: Secondary | ICD-10-CM | POA: Diagnosis present

## 2017-07-08 MED ORDER — CLOBETASOL PROP EMOLLIENT BASE 0.05 % EX CREA: TOPICAL_CREAM | CUTANEOUS | 0 refills | Status: DC
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
## 2017-07-08 NOTE — Discharge Instructions (Signed)
Apply a thin layer of clobetasol cream to rash up to 2 times daily.  Do not use this more than 2 times daily.  Please stop using this cream after the rash resolves.  Apply cold compresses to the face for 15-20 as needed. You can also apply a topical antibiotics such as neosporin.  If you develop new or worsening symptoms, such as difficulty breathing, burning or changes to her eyes, or feeling as if your throat is closing, please return to the emergency department for re-evaluation.
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
## 2019-02-28 ENCOUNTER — Other Ambulatory Visit: Payer: Self-pay

## 2019-02-28 ENCOUNTER — Encounter (HOSPITAL_COMMUNITY): Payer: Self-pay

## 2019-02-28 DIAGNOSIS — H538 Other visual disturbances: Secondary | ICD-10-CM | POA: Insufficient documentation

## 2019-02-28 DIAGNOSIS — R110 Nausea: Secondary | ICD-10-CM | POA: Insufficient documentation

## 2019-02-28 DIAGNOSIS — Z5321 Procedure and treatment not carried out due to patient leaving prior to being seen by health care provider: Secondary | ICD-10-CM | POA: Insufficient documentation

## 2019-02-28 DIAGNOSIS — R51 Headache: Secondary | ICD-10-CM | POA: Insufficient documentation

## 2019-02-28 NOTE — ED Triage Notes (Addendum)
Pt reports migraine x 4 days. She reports a hx of same, but states that they are becoming more frequent. She is worried because she is experiencing similar symptoms to her grandmother when she was dx'd with a brain tumor. She endorses blurry vision and occasional black spots and nausea. No slurred speech or neuro deficits.

## 2019-03-01 ENCOUNTER — Emergency Department (HOSPITAL_COMMUNITY)
Admission: EM | Admit: 2019-03-01 | Discharge: 2019-03-01 | Disposition: A | Discharge status: Home / Self Care | Payer: Self-pay | Attending: Emergency Medicine | Admitting: Emergency Medicine

## 2019-03-01 NOTE — ED Notes (Addendum)
Call made for pt in ED lobby for vitals reassessment; no answer x1. BD 

## 2019-03-01 NOTE — ED Notes (Signed)
Role call for pt status; no answer x3. RN advised. ENMiles  

## 2019-03-01 NOTE — ED Notes (Signed)
No answer x2 for pt for vitals update. ENMiles  
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
## 2020-05-06 ENCOUNTER — Encounter (HOSPITAL_COMMUNITY): Payer: Self-pay

## 2020-05-06 ENCOUNTER — Emergency Department (HOSPITAL_COMMUNITY)
Admission: EM | Admit: 2020-05-06 | Discharge: 2020-05-06 | Disposition: A | Discharge status: Home / Self Care | Payer: 59 | Attending: Emergency Medicine | Admitting: Emergency Medicine

## 2020-05-06 ENCOUNTER — Other Ambulatory Visit: Payer: Self-pay

## 2020-05-06 DIAGNOSIS — R1310 Dysphagia, unspecified: Secondary | ICD-10-CM | POA: Diagnosis present

## 2020-05-06 DIAGNOSIS — J029 Acute pharyngitis, unspecified: Secondary | ICD-10-CM | POA: Diagnosis not present

## 2020-05-06 HISTORY — DX: Streptococcal pharyngitis: J02.0

## 2020-05-06 LAB — GROUP A STREP BY PCR: Group A Strep by PCR: NOT DETECTED

## 2020-05-06 NOTE — ED Triage Notes (Signed)
Patient arrived stating she is prone to strept throat and noticed two days ago she had white nodules on her tonsils and a sore throat. States she has been gargling saltwater and drinking lemon tea with honey.

## 2020-05-06 NOTE — ED Provider Notes (Signed)
[facility]-EMERGENCY DEPT Provider Note: Lowella Dell, MD, FACEP  CSN: [id number] MRN: [id number] ARRIVAL: 05/06/20 at 0039 ROOM: WA13/WA13   CHIEF COMPLAINT  Sore Throat   HISTORY OF PRESENT ILLNESS  05/06/20 12:56 AM Joanne Shelton is a 39 y.o. female with a history of strep throat.  She is here with a sore throat which she rates as an 8 out of 10, worse with swallowing.  She has also noticed white nodules on her tonsils.  She has been gargling with salt water and drinking lemon tea with honey without adequate relief.  She has not had a fever with this.  She states she did a shot of Jaegermeister yesterday and it burned which is not usual for her when drinking Jaegermeister.  She has also noticed some anterior cervical lymphadenopathy.  She denies Covid symptoms such as loss of taste or smell, cough, shortness of breath, body aches or chills.   Past Medical History:  Diagnosis Date  . Migraine   . Miscarriage 2014  . Strep throat     Past Surgical History:  Procedure Laterality Date  . HERNIA REPAIR      Family History  Problem Relation Age of Onset  . Diabetes Mother   . Diabetes Other     Social History   Tobacco Use  . Smoking status: Never Smoker  . Smokeless tobacco: Never Used  Substance Use Topics  . Alcohol use: Yes    Comment: occ  . Drug use: No    Prior to Admission medications   Medication Sig Start Date End Date Taking? Authorizing Provider  Clobetasol Prop Emollient Base (CLOBETASOL PROPIONATE E) 0.05 % emollient cream Apply a thin layer twice daily. 07/08/17   McDonald, Mia A, PA-C  naproxen (NAPROSYN) 375 MG tablet Take 1 tablet (375 mg total) by mouth 2 (two) times daily. 07/07/17   Janne Napoleon, NP  norgestimate-ethinyl estradiol (ORTHO-CYCLEN,SPRINTEC,PREVIFEM) 0.25-35 MG-MCG tablet Take 1 tablet by mouth daily.     [provider]    Allergies Codeine   REVIEW OF SYSTEMS  Negative except as noted here or in the History of Present  Illness.   PHYSICAL EXAMINATION  Initial Vital Signs Blood pressure 126/86, pulse 93, temperature 98.7 F (37.1 C), temperature source Oral, resp. rate 18, SpO2 100 %.  Examination General: Well-developed, well-nourished female in no acute distress; appearance consistent with age of record HENT: normocephalic; atraumatic; mild pharyngeal erythema without exudate or edema Eyes: pupils equal, round and reactive to light; extraocular muscles intact Neck: supple; mild anterior cervical lymphadenopathy Heart: regular rate and rhythm Lungs: clear to auscultation bilaterally Abdomen: soft; nondistended; nontender; bowel sounds present Extremities: No deformity; full range of motion; pulses normal Neurologic: Awake, alert and oriented; motor function intact in all extremities and symmetric; no facial droop Skin: Warm and dry Psychiatric: Normal mood and affect   RESULTS  Summary of this visit's results, reviewed and interpreted by myself:   EKG Interpretation  Date/Time:    Ventricular Rate:    PR Interval:    QRS Duration:   QT Interval:    QTC Calculation:   R Axis:     Text Interpretation:        Laboratory Studies: Results for orders placed or performed during the hospital encounter of 05/06/20 (from the past 24 hour(s))  Group A Strep by PCR     Status: None   Collection Time: 05/06/20  1:27 AM   Specimen: Throat; Sterile Swab  Result Value Ref Range  Group A Strep by PCR NOT DETECTED NOT DETECTED   Imaging Studies: No results found.  ED COURSE and MDM  Nursing notes, initial and subsequent vitals signs, including pulse oximetry, reviewed and interpreted by myself.  Vitals:   05/06/20 0054  BP: 126/86  Pulse: 93  Resp: 18  Temp: 98.7 F (37.1 C)  TempSrc: Oral  SpO2: 100%   Medications - No data to display  No strep detected.  Differential diagnosis includes acid reflux and she was advised to trial a PPI.   PROCEDURES  Procedures   ED DIAGNOSES      ICD-10-CM   1. Sore throat  J02.9        Batul Diego, Jonny Ruiz, MD 05/06/20 [id number]

## 2020-12-23 ENCOUNTER — Other Ambulatory Visit: Payer: Self-pay | Admitting: Obstetrics & Gynecology

## 2020-12-23 DIAGNOSIS — Z1231 Encounter for screening mammogram for malignant neoplasm of breast: Secondary | ICD-10-CM

## 2021-01-27 ENCOUNTER — Ambulatory Visit: Payer: 59

## 2022-09-16 ENCOUNTER — Other Ambulatory Visit: Payer: Self-pay

## 2022-09-16 ENCOUNTER — Emergency Department (HOSPITAL_COMMUNITY)
Admission: EM | Admit: 2022-09-16 | Discharge: 2022-09-17 | Disposition: A | Discharge status: Home / Self Care | Payer: Medicaid Other | Attending: Emergency Medicine | Admitting: Emergency Medicine

## 2022-09-16 ENCOUNTER — Encounter (HOSPITAL_COMMUNITY): Payer: Self-pay

## 2022-09-16 DIAGNOSIS — G43901 Migraine, unspecified, not intractable, with status migrainosus: Secondary | ICD-10-CM | POA: Diagnosis not present

## 2022-09-16 DIAGNOSIS — R519 Headache, unspecified: Secondary | ICD-10-CM | POA: Diagnosis present

## 2022-09-16 LAB — CBC WITH DIFFERENTIAL/PLATELET
Abs Immature Granulocytes: 0.03 10*3/uL (ref 0.00–0.07)
Basophils Absolute: 0.1 10*3/uL (ref 0.0–0.1)
Basophils Relative: 1 %
Eosinophils Absolute: 0.4 10*3/uL (ref 0.0–0.5)
Eosinophils Relative: 4 %
HCT: 37.4 % (ref 36.0–46.0)
Hemoglobin: 11.7 g/dL — ABNORMAL LOW (ref 12.0–15.0)
Immature Granulocytes: 0 %
Lymphocytes Relative: 41 %
Lymphs Abs: 4 10*3/uL (ref 0.7–4.0)
MCH: 21.7 pg — ABNORMAL LOW (ref 26.0–34.0)
MCHC: 31.3 g/dL (ref 30.0–36.0)
MCV: 69.3 fL — ABNORMAL LOW (ref 80.0–100.0)
Monocytes Absolute: 0.7 10*3/uL (ref 0.1–1.0)
Monocytes Relative: 7 %
Neutro Abs: 4.6 10*3/uL (ref 1.7–7.7)
Neutrophils Relative %: 47 %
Platelets: 340 10*3/uL (ref 150–400)
RBC: 5.4 MIL/uL — ABNORMAL HIGH (ref 3.87–5.11)
RDW: 15.2 % (ref 11.5–15.5)
WBC: 9.8 10*3/uL (ref 4.0–10.5)
nRBC: 0 % (ref 0.0–0.2)

## 2022-09-16 LAB — COMPREHENSIVE METABOLIC PANEL
ALT: 16 U/L (ref 0–44)
AST: 19 U/L (ref 15–41)
Albumin: 4.3 g/dL (ref 3.5–5.0)
Alkaline Phosphatase: 60 U/L (ref 38–126)
Anion gap: 10 (ref 5–15)
BUN: 17 mg/dL (ref 6–20)
CO2: 26 mmol/L (ref 22–32)
Calcium: 9.8 mg/dL (ref 8.9–10.3)
Chloride: 105 mmol/L (ref 98–111)
Creatinine, Ser: 0.94 mg/dL (ref 0.44–1.00)
GFR, Estimated: >60 mL/min (ref >60)
Glucose, Bld: 85 mg/dL (ref 70–99)
Potassium: 3.6 mmol/L (ref 3.5–5.1)
Sodium: 141 mmol/L (ref 135–145)
Total Bilirubin: 1.3 mg/dL — ABNORMAL HIGH (ref 0.3–1.2)
Total Protein: 7.9 g/dL (ref 6.5–8.1)

## 2022-09-16 LAB — I-STAT BETA HCG BLOOD, ED (MC, WL, AP ONLY): I-stat hCG, quantitative: <5 m[IU]/mL (ref <5)

## 2022-09-16 MED ORDER — PROCHLORPERAZINE EDISYLATE 10 MG/2ML IJ SOLN
10.0000 mg | Freq: Once | INTRAMUSCULAR | Status: AC
  Administered 2022-09-16: 10 mg via INTRAVENOUS
  Filled 2022-09-16: qty 2

## 2022-09-16 MED ORDER — SODIUM CHLORIDE 0.9 % IV BOLUS
1000.0000 mL | Freq: Once | INTRAVENOUS | Status: AC
  Administered 2022-09-16: 1000 mL via INTRAVENOUS

## 2022-09-16 MED ORDER — KETOROLAC TROMETHAMINE 15 MG/ML IJ SOLN
15.0000 mg | Freq: Once | INTRAMUSCULAR | Status: AC
  Administered 2022-09-16: 15 mg via INTRAVENOUS
  Filled 2022-09-16: qty 1

## 2022-09-16 NOTE — ED Triage Notes (Signed)
Pt. Arrives POV for a migraine x2 days. Pt. States that this is the worse one that she has had in a year. She has not been able to keep food or water down. She states that she lost vision in her R. Eye yesterday, but it is still blurry today. Pt. Is sensitive to light and sound.

## 2022-09-16 NOTE — ED Provider Notes (Signed)
[facility] Provider Note   CSN: [id number] Arrival date & time: 09/16/22  1953     History  Chief Complaint  Patient presents with   Migraine    Joanne Shelton is a 42 y.o. female.   Migraine  Patient presents with headaches.  History of migraines.  States she has been diagnosed with migraines and gets headaches about 2-3 times a month.  States this 1 has been for 2 days.  More severe than she has had before.  States she normally gets flashing the eye but yesterday had loss of vision and then lines in her right eye.  Pain in the right eye 2.  Improving but still has some blurriness.  Has had decreased oral intake.  States that light and sound are bothering her.  Has had vomiting and not able to keep food down.    Past Medical History:  Diagnosis Date   Migraine    Miscarriage 2014   Strep throat     Home Medications Prior to Admission medications   Medication Sig Start Date End Date Taking? Authorizing Provider  Clobetasol Prop Emollient Base (CLOBETASOL PROPIONATE E) 0.05 % emollient cream Apply a thin layer twice daily. 07/08/17   McDonald, Mia A, PA-C  naproxen (NAPROSYN) 375 MG tablet Take 1 tablet (375 mg total) by mouth 2 (two) times daily. 07/07/17   Ashley Murrain, NP  norgestimate-ethinyl estradiol (ORTHO-CYCLEN,SPRINTEC,PREVIFEM) 0.25-35 MG-MCG tablet Take 1 tablet by mouth daily.     [provider]      Allergies    Codeine    Review of Systems   Review of Systems  Physical Exam Updated Vital Signs BP 126/85 (BP Location: Right Arm)   Pulse 77   Temp 99.4 F (37.4 C) (Oral)   Resp 17   Ht '5\' 2"'$  (1.575 m)   Wt 67.1 kg   SpO2 100%   BMI 27.07 kg/m  Physical Exam Vitals and nursing note reviewed.  HENT:     Head: Atraumatic.  Eyes:     Extraocular Movements: Extraocular movements intact.     Pupils: Pupils are equal, round, and reactive to light.  Cardiovascular:     Rate and Rhythm:  Regular rhythm.  Pulmonary:     Breath sounds: No wheezing.  Abdominal:     Tenderness: There is no abdominal tenderness.  Musculoskeletal:        General: No tenderness.     Cervical back: Neck supple.  Skin:    General: Skin is warm.  Neurological:     Mental Status: She is alert.     ED Results / Procedures / Treatments   Labs (all labs ordered are listed, but only abnormal results are displayed) Labs Reviewed  COMPREHENSIVE METABOLIC PANEL - Abnormal; Notable for the following components:      Result Value   Total Bilirubin 1.3 (*)    All other components within normal limits  CBC WITH DIFFERENTIAL/PLATELET - Abnormal; Notable for the following components:   RBC 5.40 (*)    Hemoglobin 11.7 (*)    MCV 69.3 (*)    MCH 21.7 (*)    All other components within normal limits  I-STAT BETA HCG BLOOD, ED ([facility], [facility], [facility] ONLY)    EKG None  Radiology No results found.  Procedures Procedures    Medications Ordered in ED Medications  sodium chloride 0.9 % bolus 1,000 mL (1,000 mLs Intravenous New Bag/Given 09/16/22 2200)  prochlorperazine (COMPAZINE) injection  10 mg (10 mg Intravenous Given 09/16/22 2157)  ketorolac (TORADOL) 15 MG/ML injection 15 mg (15 mg Intravenous Given 09/16/22 2157)    ED Course/ Medical Decision Making/ A&P                             Medical Decision Making Amount and/or Complexity of Data Reviewed Labs: ordered.  Risk Prescription drug management.   Headache and nausea vomiting.  No fever.  History of migraines with this pain is somewhat atypical for her.  States she did have a diagnosis of migraine previously.  Right eye pain and has had some tearing but does not have history of cluster headache.  No relief with oxygen.  Will check basic blood work.  Will treat with Compazine and Toradol along with fluid.  Patient has been given medicine.  Now sleeping comfortably.  Patient reevaluated woke up and states she started to feel better.  Will watch  for a little longer since she is still rather sedate.  Care turned over to Dr. Leonette Monarch.        Final Clinical Impression(s) / ED Diagnoses Final diagnoses:  Migraine with status migrainosus, not intractable, unspecified migraine type    Rx / DC Orders ED Discharge Orders     None         Davonna Belling, MD 09/16/22 2251

## 2022-10-17 ENCOUNTER — Ambulatory Visit: Payer: Medicaid Other | Admitting: Neurology

## 2022-10-17 ENCOUNTER — Encounter: Payer: Self-pay | Admitting: Neurology

## 2022-10-17 VITALS — BP 111/75 | HR 79 | Ht 62.0 in | Wt 148.2 lb

## 2022-10-17 DIAGNOSIS — G43909 Migraine, unspecified, not intractable, without status migrainosus: Secondary | ICD-10-CM

## 2022-10-17 MED ORDER — AMITRIPTYLINE HCL 25 MG PO TABS: 25.0000 mg | ORAL_TABLET | Freq: Every day | ORAL | 3 refills | Status: AC

## 2022-10-17 MED ORDER — RIZATRIPTAN BENZOATE 5 MG PO TBDP: 5.0000 mg | ORAL_TABLET | ORAL | 1 refills | Status: AC | PRN

## 2022-10-17 NOTE — Patient Instructions (Addendum)
Please remember, common headache triggers are: sleep deprivation, dehydration, overheating, stress, hypoglycemia or skipping meals and blood sugar fluctuations, excessive pain medications or excessive alcohol use or caffeine withdrawal. Some people have food triggers such as aged cheese, orange juice or chocolate, especially dark chocolate, or MSG (monosodium glutamate). Try to avoid these headache triggers as much possible. It may be helpful to keep a headache diary to figure out what makes your headaches worse or brings them on and what alleviates them. Some people report headache onset after exercise but studies have shown that regular exercise may actually prevent headaches from coming. If you have exercise-induced headaches, please make sure that you drink plenty of fluid before and after exercising and that you do not over do it and do not overheat.  Try to make more time to sleep, 7-8 hours of sleep are recommended nightly.   For migraine prevention, I would like to suggest: Elavil (generic name: amitriptyline) 25 mg: Take half a pill daily at bedtime for one week, then one pill daily at bedtime thereafter. Common side effects reported are: mouth dryness, drowsiness, confusion, dizziness.   For migraine acute treatment, try the Maxalt:   Please be aware that Maxalt and amitriptyline may not be safe to take during pregnancy.   Maxalt orally disintegrating tab, 5 mg: take 1 pill early on when you suspect a migraine attack come on. You may take another pill within 2 hours, no more than 2 pills in 24 hours. Most people who take triptans do not have any serious side-effects. However, they can cause drowsiness (remember to not drive or use heavy machinery when drowsy), nausea, dizziness, dry mouth. Less common side effects include strange sensations, such as tightness in your chest or throat, tingling, flushing, and feelings of heaviness or pressure in areas such as the face, limbs, and chest. These in  the chest can mimic heart related pain (angina) and may cause alarm, but usually these sensations are not harmful or a sign of a heart attack. However, if you develop intense chest pain or sensations of discomfort, you should stop taking your medication and consult with me or your PCP or go to the nearest urgent care facility or ER or call 911.   Please follow up in about 4-6 mo with the nurse practitioner.   Please establish care with a primary care provider as soon as possible.
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
## 2022-12-20 ENCOUNTER — Other Ambulatory Visit: Payer: Self-pay

## 2022-12-20 ENCOUNTER — Emergency Department (HOSPITAL_COMMUNITY)
Admission: EM | Admit: 2022-12-20 | Discharge: 2022-12-21 | Disposition: A | Discharge status: Home / Self Care | Payer: Medicaid Other | Attending: Emergency Medicine | Admitting: Emergency Medicine

## 2022-12-20 ENCOUNTER — Encounter (HOSPITAL_COMMUNITY): Payer: Self-pay

## 2022-12-20 DIAGNOSIS — T7849XA Other allergy, initial encounter: Secondary | ICD-10-CM | POA: Insufficient documentation

## 2022-12-20 DIAGNOSIS — R000 Tachycardia, unspecified: Secondary | ICD-10-CM | POA: Diagnosis not present

## 2022-12-20 DIAGNOSIS — T7840XA Allergy, unspecified, initial encounter: Secondary | ICD-10-CM

## 2022-12-20 DIAGNOSIS — L539 Erythematous condition, unspecified: Secondary | ICD-10-CM | POA: Diagnosis present

## 2022-12-20 MED ORDER — FLUTICASONE PROPIONATE 50 MCG/ACT NA SUSP
2.0000 | Freq: Every day | NASAL | Status: DC
  Administered 2022-12-20: 2 via NASAL
  Filled 2022-12-20: qty 16

## 2022-12-20 MED ORDER — DEXAMETHASONE 1 MG/ML PO CONC: 8.0000 mg | Freq: Once | ORAL | Status: DC

## 2022-12-20 MED ORDER — DIPHENHYDRAMINE HCL 25 MG PO CAPS
50.0000 mg | ORAL_CAPSULE | Freq: Once | ORAL | Status: AC
  Administered 2022-12-20: 50 mg via ORAL
  Filled 2022-12-20: qty 2

## 2022-12-20 MED ORDER — DEXAMETHASONE 10 MG/ML FOR PEDIATRIC ORAL USE
8.0000 mg | Freq: Once | INTRAMUSCULAR | Status: AC
  Administered 2022-12-20: 8 mg via ORAL
  Filled 2022-12-20: qty 1

## 2022-12-20 MED ORDER — FAMOTIDINE 20 MG PO TABS
20.0000 mg | ORAL_TABLET | Freq: Once | ORAL | Status: AC
  Administered 2022-12-20: 20 mg via ORAL
  Filled 2022-12-20: qty 1

## 2022-12-20 NOTE — ED Triage Notes (Signed)
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 Pt has redness to her upper chest, neck, and face.

## 2022-12-21 MED ORDER — EPINEPHRINE 0.3 MG/0.3ML IJ SOAJ: 0.3000 mg | INTRAMUSCULAR | 0 refills | Status: AC | PRN

## 2022-12-21 NOTE — Discharge Instructions (Signed)
You had a allergic reaction I like you to start taking Claritin as well as Pepcid daily for the next 7 days.  I have given you an EpiPen to use if you develop any tongue throat lip swelling difficulty breathing, if you use the EpiPen you must call 911 for further assessment, to help with the congestion in your left nostril you may use the Flonase as given to you 1 to 2 sprays daily for neck 7 days.  Please follow-up your primary doctor

## 2022-12-21 NOTE — ED Provider Notes (Signed)
[place] EMERGENCY DEPARTMENT AT [facility] Provider Note   CSN: [id number] Arrival date & time: 12/20/22  2140     History  Chief Complaint  Patient presents with   Allergic Reaction    Joanne Shelton is a 42 y.o. female.  HPI   Patient with medical history including hernia repair presenting with complaints of allergic reaction.  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  Patient states that typically when she is exposed to capsaicin she typically gets hives she has never had to be hospitalized for this.  She states she was exposed at 930 and came immediately to the hospital she has not had any medications.  Home Medications Prior to Admission medications   Medication Sig Start Date End Date Taking? Authorizing Provider  EPINEPHrine (EPIPEN 2-PAK) 0.3 mg/0.3 mL IJ SOAJ injection Inject 0.3 mg into the muscle as needed for anaphylaxis. 12/21/22  Yes Carroll Sage, PA-C  acetaminophen (TYLENOL) 500 MG tablet Take 1,500 mg by mouth every 8 (eight) hours as needed.    [provider]  amitriptyline (ELAVIL) 25 MG tablet Take 1 tablet (25 mg total) by mouth at bedtime. Follow instructions provided in writing. 10/17/22   Huston Foley, MD  naproxen (NAPROSYN) 375 MG tablet Take 1 tablet (375 mg total) by mouth 2 (two) times daily. 07/07/17   Janne Napoleon, NP  rizatriptan (MAXALT-MLT) 5 MG disintegrating tablet Take 1 tablet (5 mg total) by mouth as needed for migraine. May repeat in 2 h prn, no more than 2 pills/24 h, no more than 3 pills/week. 10/17/22   Huston Foley, MD      Allergies    Codeine, Bee venom, and Capsaicin    Review of Systems   Review  of Systems  Constitutional:  Negative for chills and fever.  Respiratory:  Negative for shortness of breath.   Cardiovascular:  Negative for chest pain.  Gastrointestinal:  Negative for abdominal pain.  Skin:  Positive for rash.  Neurological:  Negative for headaches.    Physical Exam Updated Vital Signs BP (!) 136/92   Pulse (!) 103   Temp 99.1 F (37.3 C) (Oral)   Resp 16   Ht 5\' 2"  (1.575 m)   Wt 64.9 kg   SpO2 99%   BMI 26.16 kg/m  Physical Exam Vitals and nursing note reviewed.  Constitutional:      General: She is not in acute distress.    Appearance: She is not ill-appearing.  HENT:     Head: Normocephalic and atraumatic.     Comments: Patient is slight erythema noted on the left aspect of her face and her chin, no hives present.  No peeling of the skin.    Nose: Congestion present.     Comments: Right nasal passages patent and open, left nasal passage has edematous turbinates with congestion present    Mouth/Throat:     Mouth: Mucous membranes are moist.     Pharynx: Oropharynx is clear. No oropharyngeal exudate or posterior oropharyngeal erythema.     Comments: No trismus no torticollis tongue uvula both midline controlling oral secretions tonsils both equal symmetric  bilaterally no submandibular swelling no muffled tone voice. Eyes:     Conjunctiva/sclera: Conjunctivae normal.  Cardiovascular:     Rate and Rhythm: Regular rhythm. Tachycardia present.     Pulses: Normal pulses.     Heart sounds: No murmur heard.    No friction rub. No gallop.  Pulmonary:     Effort: No respiratory distress.     Breath sounds: No wheezing, rhonchi or rales.  Skin:    General: Skin is warm and dry.     Comments: Patient has noted erythema on the upper aspect of her chest no noted hives, there is no noted rash on her upper and or lower extremities.  Neurological:     Mental Status: She is alert.  Psychiatric:        Mood and Affect: Mood normal.     ED Results / Procedures  / Treatments   Labs (all labs ordered are listed, but only abnormal results are displayed) Labs Reviewed - No data to display  EKG None  Radiology No results found.  Procedures Procedures    Medications Ordered in ED Medications  fluticasone (FLONASE) 50 MCG/ACT nasal spray 2 spray (2 sprays Each Nare Given 12/20/22 2253)  diphenhydrAMINE (BENADRYL) capsule 50 mg (50 mg Oral Given 12/20/22 2253)  famotidine (PEPCID) tablet 20 mg (20 mg Oral Given 12/20/22 2253)  dexamethasone (DECADRON) 10 MG/ML injection for Pediatric ORAL use 8 mg (8 mg Oral Given 12/20/22 2253)    ED Course/ Medical Decision Making/ A&P                             Medical Decision Making Risk Prescription drug management.   This patient presents to the ED for concern of allergic reaction, this involves an extensive number of treatment options, and is a complaint that carries with it a high risk of complications and morbidity.  The differential diagnosis includes anaphylaxis, angioedema, cellulitis    Additional history obtained:  Additional history obtained from N/A External records from outside source obtained and reviewed including neurology note   Co morbidities that complicate the patient evaluation  N/A  Social Determinants of Health:  N/A    Lab Tests:  I Ordered, and personally interpreted labs.  The pertinent results include: N/A   Imaging Studies ordered:  I ordered imaging studies including N/A I independently visualized and interpreted imaging which showed n/a I agree with the radiologist interpretation   Cardiac Monitoring:  The patient was maintained on a cardiac monitor.  I personally viewed and interpreted the cardiac monitored which showed an underlying rhythm of: n/a   Medicines ordered and prescription drug management:  I ordered medication including prednisone, H1 and H2 blockers I have reviewed the patients home medicines and have made adjustments as  needed  Critical Interventions:  N/A   Reevaluation:  Presents with very mild allergic reaction will provide with H1 and H2 blockers, Decadron, will also give her Flonase to help with the edematous left turbinate  Patient was reassessed rash is completely resolved patient states she is feeling much better, patient is agreement discharge at this time  Consultations Obtained:  N/a    Test Considered:  N/a    Rule out Suspicion for angioedema/anaphylaxis is very low at this time no oral involvement, patient vital signs have remained stable, no GI symptoms.  I doubt Stevens-Johnson's as there is no sloughing of the skin, presentation atypical of etiology.  It is noted  that patient's heart rate was elevated on arrival but this has since improved, I do not feel this is a infectious etiology likely from allergic reaction.    Dispostion and problem list  After consideration of the diagnostic results and the patients response to treatment, I feel that the patent would benefit from discharge.  1.  Allergic reaction-will have her continue with H1 and H2 blockers, follow-up with an allergist for further evaluation, will provide her with an EpiPen as she says she does not have one and is allergic to bees.            Final Clinical Impression(s) / ED Diagnoses Final diagnoses:  Allergic reaction, initial encounter    Rx / DC Orders ED Discharge Orders          Ordered    EPINEPHrine (EPIPEN 2-PAK) 0.3 mg/0.3 mL IJ SOAJ injection  As needed        12/21/22 0039              Carroll Sage, PA-C 12/21/22 0044    Palumbo, April, MD 12/21/22 1610

## 2023-02-26 NOTE — Progress Notes (Deleted)
[facility] [address] [place]. [place] [address] [phone]       OFFICE FOLLOW UP NOTE  [name] Date of Birth:  Sep 02, 1980 Medical Record Number:  [id number]    Primary neurologist: Dr. Frances Furbish Reason for visit: Migraine headaches    SUBJECTIVE:   CHIEF COMPLAINT:  No chief complaint on file.  Follow-up visit:  Prior visit: 10/17/2022 with Dr. Frances Furbish  Brief HPI:   Joanne Shelton is a 42 y.o. female who was initially evaluated by Dr. Frances Furbish on 10/17/2022 for worsening migraines over the past several months.  Migraines located right side associated with throbbing sensation, visual symptoms (scotoma, lines and spots) nausea and at times vomiting.   At prior visit, started on amitriptyline for preventative and rizatriptan for rescue   Interval history:         ROS:   14 system review of systems performed and negative with exception of those listed in HPI  PMH:  Past Medical History:  Diagnosis Date   Migraine    Miscarriage 2014   Strep throat     PSH:  Past Surgical History:  Procedure Laterality Date   HERNIA REPAIR      Social History:  Social History   Socioeconomic History   Marital status: Single    Spouse name: Not on file   Number of children: Not on file   Years of education: Not on file   Highest education level: Not on file  Occupational History   Not on file  Tobacco Use   Smoking status: Never   Smokeless tobacco: Never  Vaping Use   Vaping status: Never Used  Substance and Sexual Activity   Alcohol use: Yes    Comment: occ   Drug use: No   Sexual activity: Not on file  Other Topics Concern   Not on file  Social History Narrative   Caffiene 1once monthly   Works afternoon/evenng shift restaurant   Social Determinants of Health   Financial Resource Strain: Not on file  Food Insecurity: Not on file  Transportation Needs: Not on file  Physical Activity: Not on file  Stress: Not on file   Social Connections: Not on file  Intimate Partner Violence: Not on file    Family History:  Family History  Problem Relation Age of Onset   Migraines Mother    Diabetes Mother    Cancer Father    Migraines Maternal Grandmother    Diabetes Other     Medications:   Current Outpatient Medications on File Prior to Visit  Medication Sig Dispense Refill   acetaminophen (TYLENOL) 500 MG tablet Take 1,500 mg by mouth every 8 (eight) hours as needed.     amitriptyline (ELAVIL) 25 MG tablet Take 1 tablet (25 mg total) by mouth at bedtime. Follow instructions provided in writing. 30 tablet 3   EPINEPHrine (EPIPEN 2-PAK) 0.3 mg/0.3 mL IJ SOAJ injection Inject 0.3 mg into the muscle as needed for anaphylaxis. 1 each 0   naproxen (NAPROSYN) 375 MG tablet Take 1 tablet (375 mg total) by mouth 2 (two) times daily. 20 tablet 0   rizatriptan (MAXALT-MLT) 5 MG disintegrating tablet Take 1 tablet (5 mg total) by mouth as needed for migraine. May repeat in 2 h prn, no more than 2 pills/24 h, no more than 3 pills/week. 10 tablet 1   No current facility-administered medications on file prior to visit.    Allergies:   Allergies  Allergen Reactions   Codeine Anaphylaxis  and Hives   Bee Venom    Capsaicin       OBJECTIVE:  Physical Exam  There were no vitals filed for this visit. There is no height or weight on file to calculate BMI. No results found.   General: well developed, well nourished, seated, in no evident distress Head: head normocephalic and atraumatic.   Neck: supple with no carotid or supraclavicular bruits Cardiovascular: regular rate and rhythm, no murmurs Musculoskeletal: no deformity Skin:  no rash/petichiae Vascular:  Normal pulses all extremities   Neurologic Exam Mental Status: Awake and fully alert. Oriented to place and time. Recent and remote memory intact. Attention span, concentration and fund of knowledge appropriate. Mood and affect appropriate.  Cranial  Nerves: Pupils equal, briskly reactive to light. Extraocular movements full without nystagmus. Visual fields full to confrontation. Hearing intact. Facial sensation intact. Face, tongue, palate moves normally and symmetrically.  Motor: Normal bulk and tone. Normal strength in all tested extremity muscles Sensory.: intact to touch , pinprick , position and vibratory sensation.  Coordination: Rapid alternating movements normal in all extremities. Finger-to-nose and heel-to-shin performed accurately bilaterally. Gait and Station: Arises from chair without difficulty. Stance is normal. Gait demonstrates normal stride length and balance without use of AD. Tandem walk and heel toe without difficulty.  Reflexes: 1+ and symmetric. Toes downgoing.         ASSESSMENT/PLAN: Joanne Shelton is a 42 y.o. year old female with longstanding history of migraine headache gradually worsened since 08/2022    Episodic migraine:  Continue amitriptyline *** Continue rizatriptan ***     Follow up in *** or call earlier if needed   CC:  PCP: Patient, No Pcp Per    I spent *** minutes of face-to-face and non-face-to-face time with patient.  This included previsit chart review, lab review, study review, order entry, electronic health record documentation, patient education regarding diagnosis of sleep apnea with review and discussion of compliance report and answered all other questions to patient's satisfaction   Ihor Austin, [facility]  [facility] [address] [place], [place] [address]  Phone [phone] Fax [phone] Note: This document was prepared with digital dictation and possible smart phrase technology. Any transcriptional errors that result from this process are unintentional.

## 2023-02-27 ENCOUNTER — Encounter: Payer: Self-pay | Admitting: Adult Health

## 2023-02-27 ENCOUNTER — Ambulatory Visit: Payer: Medicaid Other | Admitting: Adult Health

## 2023-03-05 ENCOUNTER — Ambulatory Visit (HOSPITAL_COMMUNITY): Payer: Medicaid Other | Admitting: Clinical

## 2023-04-11 ENCOUNTER — Telehealth: Payer: Self-pay

## 2023-04-11 NOTE — Telephone Encounter (Signed)
Patient called and stated that she has spot on her scalp(crown of head), requesting to schedule an appointment. Patient informed [facility] is for breast and cervical, needs to see pcp or dermatologist. Patient has medicaid, was told check to see which dermatologist is covered with plan, may check with [facility], call then dropped.
